# Patient Record
Sex: Male | Born: 1990 | Race: Black or African American | Hispanic: No | Marital: Single | Smoking: Never smoker
Health system: Southern US, Community
[De-identification: ages and names within clinical notes are randomized; demographics above are authoritative.]

## PROBLEM LIST (undated history)

## (undated) DIAGNOSIS — R0789 Other chest pain: Secondary | ICD-10-CM

## (undated) DIAGNOSIS — M25519 Pain in unspecified shoulder: Secondary | ICD-10-CM

## (undated) DIAGNOSIS — G43909 Migraine, unspecified, not intractable, without status migrainosus: Secondary | ICD-10-CM

## (undated) HISTORY — DX: Migraine, unspecified, not intractable, without status migrainosus: G43.909

---

## 2011-05-05 ENCOUNTER — Other Ambulatory Visit: Payer: Self-pay

## 2011-05-05 ENCOUNTER — Emergency Department (HOSPITAL_COMMUNITY): Payer: Self-pay

## 2011-05-05 ENCOUNTER — Emergency Department (HOSPITAL_COMMUNITY)
Admission: EM | Admit: 2011-05-05 | Discharge: 2011-05-06 | Disposition: A | Discharge status: Home / Self Care | Payer: Self-pay | Attending: Emergency Medicine | Admitting: Emergency Medicine

## 2011-05-05 DIAGNOSIS — R079 Chest pain, unspecified: Secondary | ICD-10-CM | POA: Insufficient documentation

## 2011-05-05 DIAGNOSIS — R0789 Other chest pain: Secondary | ICD-10-CM

## 2011-05-05 LAB — DIFFERENTIAL
Basophils Absolute: 0.1 10*3/uL (ref 0.0–0.1)
Lymphocytes Relative: 29 % (ref 12–46)
Lymphs Abs: 2.7 10*3/uL (ref 0.7–4.0)
Monocytes Absolute: 0.9 10*3/uL (ref 0.1–1.0)
Neutro Abs: 5.4 10*3/uL (ref 1.7–7.7)

## 2011-05-05 LAB — CBC
HCT: 45.1 % (ref 39.0–52.0)
Hemoglobin: 15.7 g/dL (ref 13.0–17.0)
RBC: 5.33 MIL/uL (ref 4.22–5.81)
RDW: 12.2 % (ref 11.5–15.5)
WBC: 9.1 10*3/uL (ref 4.0–10.5)

## 2011-05-05 NOTE — ED Notes (Signed)
Pt given warm blankets.

## 2011-05-05 NOTE — ED Notes (Signed)
Pt reprots cp off/on x 2 years

## 2011-05-05 NOTE — ED Provider Notes (Addendum)
History    Scribed for Edward Bonier, MD, the patient was seen in room APA07/APA07. This chart was scribed by Katha Cabal. This patient's care was started at  21:58.     CSN: [id number] Arrival date & time: 05/05/2011  9:54 PM  Chief Complaint  Patient presents with  . Chest Pain    x 2 years off/on    HPI  (Consider location/radiation/quality/duration/timing/severity/associated sxs/prior treatment)  HPI Edward Montgomery is a 20 y.o. male who presents to the Emergency Department complaining of intermittent sharp left chest pain with SOB, light headedness   Pain lasts for several hours.  Nothing makes pain worse.  Pain began about two years ago but has gotten over the last couple of months.  Denies palpitations, cough, diaphoresis, and nausea.    Denies any health problems and smoking.   Patient states that he often lifts heavy boxes at work.   No PCP   PAST MEDICAL HISTORY:  History reviewed. No pertinent past medical history.  PAST SURGICAL HISTORY:  History reviewed. No pertinent past surgical history.  FAMILY HISTORY:  No family history on file.   SOCIAL HISTORY: History   Social History  . Marital Status: Single    Spouse Name: N/A    Number of Children: N/A  . Years of Education: N/A   Social History Main Topics  . Smoking status: Never Smoker   . Smokeless tobacco: None  . Alcohol Use: Yes     opcc  . Drug Use: No  . Sexually Active: No   Other Topics Concern  . None   Social History Narrative  . None     Review of Systems  Review of Systems 10 Systems reviewed and are negative for acute change except as noted in the HPI.  Allergies  Review of patient's allergies indicates not on file.  Home Medications  No current outpatient prescriptions on file.  Physical Exam    BP 129/81  Pulse 96  Temp(Src) 98.9 F (37.2 C) (Oral)  Resp 16  Ht 5\' 6"  (1.676 m)  Wt 220 lb (99.791 kg)  BMI 35.51 kg/m2  SpO2 98%  Physical Exam  Constitutional:  He is oriented to person, place, and time. He appears well-developed and well-nourished.  HENT:  Head: Normocephalic and atraumatic.  Eyes: EOM are normal.  Neck: Neck supple.  Cardiovascular: Normal rate, regular rhythm and normal heart sounds.  Exam reveals no gallop and no friction rub.   No murmur heard. Pulmonary/Chest: Effort normal and breath sounds normal. No respiratory distress. He has no wheezes. He has no rales. He exhibits no tenderness.  Abdominal: Soft. There is no tenderness.  Musculoskeletal: Normal range of motion. He exhibits no edema.  Neurological: He is alert and oriented to person, place, and time.  Skin: Skin is warm and dry. No rash noted.  Psychiatric: He has a normal mood and affect. His behavior is normal.    ED Course  Procedures (including critical care time)  OTHER DATA REVIEWED: Nursing notes, vital signs, and past medical records reviewed.   DIAGNOSTIC STUDIES: Oxygen Saturation is 98% on room air, normal by my interpretation.     Date: 05/05/2011  Rate: 84  Rhythm: normal sinus rhythm  QRS Axis: normal  Intervals: normal  ST/T Wave abnormalities: normal  Conduction Disutrbances:none  Narrative Interpretation:   Old EKG Reviewed: none available Interpreted by EDMD.      LABS / RADIOLOGY:  Results for orders placed during the hospital encounter of 05/05/11  CBC      Component Value Range   WBC 9.1  4.0 - 10.5 (K/uL)   RBC 5.33  4.22 - 5.81 (MIL/uL)   Hemoglobin 15.7  13.0 - 17.0 (g/dL)   HCT 13.0  86.5 - 78.4 (%)   MCV 84.6  78.0 - 100.0 (fL)   MCH 29.5  26.0 - 34.0 (pg)   MCHC 34.8  30.0 - 36.0 (g/dL)   RDW 69.6  29.5 - 28.4 (%)   Platelets 385  150 - 400 (K/uL)  DIFFERENTIAL      Component Value Range   Neutrophils Relative 59  43 - 77 (%)   Neutro Abs 5.4  1.7 - 7.7 (K/uL)   Lymphocytes Relative 29  12 - 46 (%)   Lymphs Abs 2.7  0.7 - 4.0 (K/uL)   Monocytes Relative 9  3 - 12 (%)   Monocytes Absolute 0.9  0.1 - 1.0 (K/uL)    Eosinophils Relative 2  0 - 5 (%)   Eosinophils Absolute 0.1  0.0 - 0.7 (K/uL)   Basophils Relative 1  0 - 1 (%)   Basophils Absolute 0.1  0.0 - 0.1 (K/uL)  BASIC METABOLIC PANEL      Component Value Range   Sodium 138  135 - 145 (mEq/L)   Potassium 3.7  3.5 - 5.1 (mEq/L)   Chloride 102  96 - 112 (mEq/L)   CO2 26  19 - 32 (mEq/L)   Glucose, Bld 106 (*) 70 - 99 (mg/dL)   BUN 15  6 - 23 (mg/dL)   Creatinine, Ser 1.32  0.50 - 1.35 (mg/dL)   Calcium 44.0  8.4 - 10.5 (mg/dL)   GFR calc non Af Amer >60  >60 (mL/min)   GFR calc Af Amer >60  >60 (mL/min)  TROPONIN I      Component Value Range   Troponin I <0.30  <0.30 (ng/mL)     No results found.    ED COURSE / COORDINATION OF CARE:  Orders Placed This Encounter  Procedures  . DG Chest 2 View  . CBC  . Differential  . Basic metabolic panel  . Troponin I  . ED EKG    MDM: Musculoskeletal chest pain, pericarditis, acute coronary syndrome, myocardial infarction, pneumonia are all considered in the patient's differential diagnosis. Musculoskeletal chest pain appears to be the most likely etiology of the patient's pain as the pain is atypical, in that it is paroxysmal, and sharp, intermittent, not brought on by exertion. The patient has not had any cough or fever to suggest a pneumonia. The the intermittent nature of the patient's symptoms do not fit with pericarditis. Hence musculoskeletal chest pain is thought to be most likely. Laboratory studies show no abnormalities and no sign of damage to the heart. The patient's electrocardiogram is normal. He is at this time awaiting chest x-ray to evaluate for any other intrathoracic abnormality causes pain however anticipate this is likely going to be negative and the patient should be able to be discharged home. The patient has been signed out to Dr. Sunnie Nielsen to followup the chest x-ray prior to discharging the patient home.  IMPRESSION: Diagnoses that have been ruled out:  Diagnoses  that are still under consideration:  Final diagnoses:     DISCHARGE MEDICATIONS: New Prescriptions   No medications on file     I personally performed the services described in this documentation, which was scribed in my presence. The recorded information has been reviewed and considered.\  Edward Bonier, MD 05/06/11 0017  CXR reviewed and results shared with PT. Discharged home in stable condition.   Sunnie Nielsen, MD 05/06/11 [phone]

## 2011-05-06 LAB — BASIC METABOLIC PANEL
CO2: 26 mEq/L (ref 19–32)
Chloride: 102 mEq/L (ref 96–112)
Creatinine, Ser: 1.16 mg/dL (ref 0.50–1.35)

## 2011-05-06 LAB — TROPONIN I: Troponin I: <0.3 ng/mL (ref <0.30)

## 2011-05-06 MED ORDER — IBUPROFEN 800 MG PO TABS: 800.0000 mg | ORAL_TABLET | Freq: Three times a day (TID) | ORAL | Status: AC | PRN

## 2011-09-17 ENCOUNTER — Encounter: Payer: Self-pay | Admitting: Family Medicine

## 2011-09-17 ENCOUNTER — Ambulatory Visit (INDEPENDENT_AMBULATORY_CARE_PROVIDER_SITE_OTHER): Payer: BC Managed Care – PPO | Admitting: Family Medicine

## 2011-09-17 VITALS — BP 110/82 | HR 84 | Resp 16 | Ht 66.0 in | Wt 208.8 lb

## 2011-09-17 DIAGNOSIS — G43909 Migraine, unspecified, not intractable, without status migrainosus: Secondary | ICD-10-CM | POA: Insufficient documentation

## 2011-09-17 DIAGNOSIS — R0789 Other chest pain: Secondary | ICD-10-CM

## 2011-09-17 DIAGNOSIS — Z13220 Encounter for screening for lipoid disorders: Secondary | ICD-10-CM

## 2011-09-17 DIAGNOSIS — E669 Obesity, unspecified: Secondary | ICD-10-CM

## 2011-09-17 NOTE — Assessment & Plan Note (Signed)
Discussed need to incorporate exercise into daily routine

## 2011-09-17 NOTE — Assessment & Plan Note (Signed)
Migraines currently are rare, ibuprofen as needed. If he does began to have migraines would give trial of Imitrex

## 2011-09-17 NOTE — Progress Notes (Signed)
  Subjective:    Patient ID: Edward Montgomery, male    DOB: 1991/02/08, 21 y.o.   MRN: [id number]  HPI Patient here to establish care. No previous PCP.  Chest pain- patient has history of atypical chest pain for approximately 2-3 years. He was seen in the emergency room in September 2012 for chest pain. He tends to get sharp chest pain on both sides of the chest when he is lifting boxes at work. He does however have chest pain at home when he is at rest. He denies any shortness of breath, nausea, vomiting, diaphoresis. He denies heartburn. Occasionally he has chest pain when he is taking a deep breath. EKG was within normal limits. Chest x-ray was normal. BMET and CBC were normal. He has a family history of myocardial infarction in his father at age 74. He tends to have improvement in his chest pain when he is not lifting or after he goes home from his long shifts. He does take ibuprofen occasionally which helps.  Migraine- has occasional headaches, no previous treatment, they do not occur that often  He does not exercise  Plans to get degree for early childhood development and business Non smoker Works in [employer] in [place]  He plans to schedule dental and eye visit  Review of Systems   GEN- denies fatigue, fever, weight loss,weakness, recent illness HEENT- denies eye drainage, change in vision, nasal discharge, CVS- +chest pain, palpitations RESP- denies SOB, cough, wheeze ABD- denies N/V, change in stools, abd pain GU- denies dysuria, hematuria, dribbling, incontinence MSK- denies joint pain, muscle aches, injury Neuro- + headache, dizziness, syncope, seizure activity      Objective:   Physical Exam GEN- NAD, alert and oriented x3, overweight  HEENT- PERRL, EOMI, non injected sclera, pink conjunctiva, MMM, oropharynx clear, wax obscuring right TM, left TM canal clear Neck- Supple,  CVS- RRR, no murmur RESP-CTAB ABD- ,NABS, soft, NT ND EXT- No edema Pulses- Radial, DP- 2+  EKG-  NSR, Sept 2012 reviewed CXR- negative      Assessment & Plan:

## 2011-09-17 NOTE — Assessment & Plan Note (Signed)
He has has recurrent chest pain for years without any complications. Work-up negative. I think this more MSK pain, no GI symptoms, not anxious appearing.  Will obtain FLP with family history and pts weight Continue NSAIDS I discussed cardiology with pt, but he is low risk for any cardiac etiology- deferred at this time

## 2011-09-17 NOTE — Patient Instructions (Signed)
It was nice to meet you! I think your chest pain is more musculoskeletal pain- use ibuprofen as needed Get your cholesterol drawn- do not eat after midnight We will call with lab results You should have a yearly physical  I recommend 30 minutes of exercise 5 times a week to maintain a healthy weight  Follow-up as needed

## 2011-09-18 LAB — LIPID PANEL
Cholesterol: 159 mg/dL (ref 0–200)
HDL: 43 mg/dL (ref >39)
Total CHOL/HDL Ratio: 3.7 Ratio

## 2011-09-20 NOTE — Progress Notes (Signed)
Pt aware.

## 2012-08-13 ENCOUNTER — Encounter (HOSPITAL_COMMUNITY): Payer: Self-pay

## 2012-08-13 ENCOUNTER — Emergency Department (HOSPITAL_COMMUNITY)
Admission: EM | Admit: 2012-08-13 | Discharge: 2012-08-13 | Disposition: A | Discharge status: Home / Self Care | Payer: Self-pay | Attending: Emergency Medicine | Admitting: Emergency Medicine

## 2012-08-13 ENCOUNTER — Emergency Department (HOSPITAL_COMMUNITY): Payer: Self-pay

## 2012-08-13 DIAGNOSIS — R079 Chest pain, unspecified: Secondary | ICD-10-CM | POA: Insufficient documentation

## 2012-08-13 DIAGNOSIS — B349 Viral infection, unspecified: Secondary | ICD-10-CM

## 2012-08-13 DIAGNOSIS — Z8679 Personal history of other diseases of the circulatory system: Secondary | ICD-10-CM | POA: Insufficient documentation

## 2012-08-13 DIAGNOSIS — R109 Unspecified abdominal pain: Secondary | ICD-10-CM | POA: Insufficient documentation

## 2012-08-13 DIAGNOSIS — R059 Cough, unspecified: Secondary | ICD-10-CM | POA: Insufficient documentation

## 2012-08-13 DIAGNOSIS — B9789 Other viral agents as the cause of diseases classified elsewhere: Secondary | ICD-10-CM | POA: Insufficient documentation

## 2012-08-13 DIAGNOSIS — IMO0001 Reserved for inherently not codable concepts without codable children: Secondary | ICD-10-CM | POA: Insufficient documentation

## 2012-08-13 DIAGNOSIS — R05 Cough: Secondary | ICD-10-CM | POA: Insufficient documentation

## 2012-08-13 DIAGNOSIS — R42 Dizziness and giddiness: Secondary | ICD-10-CM | POA: Insufficient documentation

## 2012-08-13 DIAGNOSIS — R509 Fever, unspecified: Secondary | ICD-10-CM | POA: Insufficient documentation

## 2012-08-13 MED ORDER — ACETAMINOPHEN 325 MG PO TABS
650.0000 mg | ORAL_TABLET | Freq: Once | ORAL | Status: AC
  Administered 2012-08-13: 650 mg via ORAL
  Filled 2012-08-13: qty 2

## 2012-08-13 MED ORDER — OSELTAMIVIR PHOSPHATE 75 MG PO CAPS
75.0000 mg | ORAL_CAPSULE | Freq: Once | ORAL | Status: AC
  Administered 2012-08-13: 75 mg via ORAL
  Filled 2012-08-13: qty 1

## 2012-08-13 MED ORDER — IBUPROFEN 800 MG PO TABS: 800.0000 mg | ORAL_TABLET | Freq: Three times a day (TID) | ORAL | Status: DC

## 2012-08-13 MED ORDER — OSELTAMIVIR PHOSPHATE 75 MG PO CAPS: 75.0000 mg | ORAL_CAPSULE | Freq: Two times a day (BID) | ORAL | Status: DC

## 2012-08-13 MED ORDER — IBUPROFEN 800 MG PO TABS
800.0000 mg | ORAL_TABLET | Freq: Once | ORAL | Status: AC
  Administered 2012-08-13: 800 mg via ORAL
  Filled 2012-08-13: qty 1

## 2012-08-13 NOTE — ED Provider Notes (Signed)
History     CSN: [id number]  Arrival date & time 08/13/12  0246   First MD Initiated Contact with Patient 08/13/12 0407      Chief Complaint  Patient presents with  . Headache  . Chest Pain  . Abdominal Pain    (Consider location/radiation/quality/duration/timing/severity/associated sxs/prior treatment) HPI Hx per PT. Fever, chills, cough and body aches started yesterday, no known sick contacts, symptoms MOD in severity, no rash, no N/V, no syncope, no SOB, no hemoptysis, no neck pain and no h/o same, did not a get a flu shot this season Past Medical History  Diagnosis Date  . Migraines     History reviewed. No pertinent past surgical history.  Family History  Problem Relation Age of Onset  . Cancer Mother     breast   . Heart disease Father     History  Substance Use Topics  . Smoking status: Never Smoker   . Smokeless tobacco: Not on file  . Alcohol Use: No      Review of Systems  Constitutional: Positive for fever and chills.  HENT: Negative for neck pain, neck stiffness and voice change.   Eyes: Negative for pain.  Respiratory: Positive for cough. Negative for shortness of breath.   Cardiovascular: Negative for chest pain.  Gastrointestinal: Negative for vomiting and abdominal pain.  Genitourinary: Negative for dysuria.  Musculoskeletal: Negative for back pain.  Skin: Negative for rash.  Neurological: Negative for headaches.  All other systems reviewed and are negative.    Allergies  Review of patient's allergies indicates no known allergies.  Home Medications   Current Outpatient Rx  Name  Route  Sig  Dispense  Refill  . IBUPROFEN 800 MG PO TABS   Oral   Take 800 mg by mouth every 8 (eight) hours as needed.           BP 121/56  Pulse 118  Temp 100.1 F (37.8 C) (Oral)  Resp 18  SpO2 98%  Physical Exam  Constitutional: He is oriented to person, place, and time. He appears well-developed and well-nourished.  HENT:  Head:  Normocephalic and atraumatic.  Mouth/Throat: Oropharynx is clear and moist. No oropharyngeal exudate.  Eyes: Conjunctivae normal and EOM are normal. Pupils are equal, round, and reactive to light. No scleral icterus.  Neck: Normal range of motion. Neck supple.  Cardiovascular: Normal rate, regular rhythm and intact distal pulses.   Pulmonary/Chest: Effort normal and breath sounds normal. No respiratory distress. He has no wheezes. He has no rales. He exhibits no tenderness.  Abdominal: Soft. Bowel sounds are normal. He exhibits no distension. There is no tenderness.  Musculoskeletal: Normal range of motion. He exhibits no edema.  Lymphadenopathy:    He has no cervical adenopathy.  Neurological: He is alert and oriented to person, place, and time.  Skin: Skin is warm and dry.    ED Course  Procedures (including critical care time)  Labs Reviewed - No data to display Dg Chest 2 View  08/13/2012  *RADIOLOGY REPORT*  Clinical Data: Chest and epigastric abdominal pain.  CHEST - 2 VIEW  Comparison: Chest radiograph performed 05/06/2011  Findings: The lungs are well-aerated.  Mild left basilar density is thought to reflect the overlying nipple shadow.  Mild vascular congestion is seen.  There is no evidence of focal opacification, pleural effusion or pneumothorax.  The heart is normal in size; the mediastinal contour is within normal limits.  No acute osseous abnormalities are seen.  IMPRESSION: Mild vascular  congestion seen; lungs remain grossly clear.   Original Report Authenticated By: Tonia Ghent, M.D.     Motrin and tylenol and tamiflu provided.   MDM  Flulike symptoms and otherwise healthy adult male. Chest x-ray reviewed as above. Tamiflu provided. Motrin and Tylenol for fever and body aches. Vital signs and nursing notes reviewed and considered. Work note provided. Flu precautions verbalized as understood.        Sunnie Nielsen, MD 08/13/12 [phone]

## 2012-08-13 NOTE — ED Notes (Signed)
Patient c/o headache, chest pain and abd pain x 1 day. Denies fevers, nausea or vomiting. Endorses chills and lightheadness.

## 2012-09-07 ENCOUNTER — Encounter (HOSPITAL_COMMUNITY): Payer: Self-pay | Admitting: *Deleted

## 2012-09-07 ENCOUNTER — Emergency Department (HOSPITAL_COMMUNITY): Payer: Self-pay

## 2012-09-07 ENCOUNTER — Emergency Department (HOSPITAL_COMMUNITY)
Admission: EM | Admit: 2012-09-07 | Discharge: 2012-09-07 | Disposition: A | Discharge status: Home / Self Care | Payer: Self-pay | Attending: Emergency Medicine | Admitting: Emergency Medicine

## 2012-09-07 DIAGNOSIS — R0789 Other chest pain: Secondary | ICD-10-CM | POA: Insufficient documentation

## 2012-09-07 DIAGNOSIS — Z8679 Personal history of other diseases of the circulatory system: Secondary | ICD-10-CM | POA: Insufficient documentation

## 2012-09-07 LAB — CBC WITH DIFFERENTIAL/PLATELET
Basophils Absolute: 0 10*3/uL (ref 0.0–0.1)
Basophils Relative: 0 % (ref 0–1)
Eosinophils Absolute: 0.2 10*3/uL (ref 0.0–0.7)
Eosinophils Relative: 2 % (ref 0–5)
HCT: 43.9 % (ref 39.0–52.0)
MCHC: 34.2 g/dL (ref 30.0–36.0)
MCV: 85.7 fL (ref 78.0–100.0)
Monocytes Absolute: 0.7 10*3/uL (ref 0.1–1.0)
RDW: 12.3 % (ref 11.5–15.5)

## 2012-09-07 LAB — BASIC METABOLIC PANEL
Calcium: 9.5 mg/dL (ref 8.4–10.5)
Creatinine, Ser: 1.16 mg/dL (ref 0.50–1.35)
GFR calc Af Amer: >90 mL/min (ref >90)

## 2012-09-07 LAB — TROPONIN I: Troponin I: <0.3 ng/mL (ref <0.30)

## 2012-09-07 MED ORDER — IBUPROFEN 800 MG PO TABS: 800.0000 mg | ORAL_TABLET | Freq: Three times a day (TID) | ORAL | Status: DC

## 2012-09-07 NOTE — ED Notes (Signed)
Pt gone to X-Ray 

## 2012-09-07 NOTE — ED Notes (Signed)
Intermittent left sided cp x 4 days.  Denies sob/n/v/dizziness.  States pain comes and goes and reports heavy lifting while working.  States job sent him home from work x 4 days ago and told him not to come back without a doctor's note.

## 2012-09-07 NOTE — ED Provider Notes (Addendum)
History  This chart was scribed for Edward Jakes, MD by Erskine Emery, ED Scribe. This patient was seen in room APA02/APA02 and the patient's care was started at 16:43.   CSN: [id number]  Arrival date & time 09/07/12  1636   First MD Initiated Contact with Patient 09/07/12 1643      Chief Complaint  Patient presents with  . Chest Pain    (Consider location/radiation/quality/duration/timing/severity/associated sxs/prior treatment) The history is provided by the patient. No language interpreter was used.  Edward Montgomery is a 22 y.o. male who presents to the Emergency Department complaining of intermittent left-sided chest pain for the past 4.5 days in episodes of about 15-20 minutes, sometimes lasting an hour. Pt reports the pain sometimes radiates to the mid chest or the right side of the chest but never radiates to his arms. He denies any associated SOB, nausea, emesis, dizziness, headaches, cough, congestion, rash, back pain, neck pain, appetite change, sore throat, visual changes, abdominal pain, dysuria, body aches, or h/o bleeding easily. He claims nothing relieves the pain and nothing aggravates it, including exertion or lifting heavy objects. Pt has a h/o similar symptoms but he has never been evaluated for it. Pt was here for the flu at the beginning of the month.  Dr. Jeanice Lim is the pt's PCP.  Past Medical History  Diagnosis Date  . Migraines     History reviewed. No pertinent past surgical history.  Family History  Problem Relation Age of Onset  . Cancer Mother     breast   . Heart disease Father     History  Substance Use Topics  . Smoking status: Never Smoker   . Smokeless tobacco: Not on file  . Alcohol Use: No      Review of Systems  Constitutional: Negative for fever, chills and appetite change.  HENT: Negative for congestion, sore throat, rhinorrhea and neck pain.   Eyes: Negative for visual disturbance.  Respiratory: Negative for cough and shortness  of breath.   Cardiovascular: Positive for chest pain.  Gastrointestinal: Negative for nausea, vomiting and abdominal pain.  Genitourinary: Negative for dysuria.  Musculoskeletal: Negative for back pain.  Skin: Negative for rash.  Neurological: Negative for dizziness and headaches.  Hematological: Does not bruise/bleed easily.  Psychiatric/Behavioral: Negative for sleep disturbance.  All other systems reviewed and are negative.    Allergies  Review of patient's allergies indicates no known allergies.  Home Medications   Current Outpatient Rx  Name  Route  Sig  Dispense  Refill  . IBUPROFEN 800 MG PO TABS   Oral   Take 400-800 mg by mouth 3 (three) times daily.         . IBUPROFEN 800 MG PO TABS   Oral   Take 1 tablet (800 mg total) by mouth 3 (three) times daily.   21 tablet   0     Triage Vitals: BP 133/73  Pulse 110  Temp 98.4 F (36.9 C) (Oral)  Resp 20  Ht 5\' 6"  (1.676 m)  Wt 230 lb (104.327 kg)  BMI 37.12 kg/m2  SpO2 99%  Physical Exam  Nursing note and vitals reviewed. Constitutional: He is oriented to person, place, and time. He appears well-developed and well-nourished. No distress.  HENT:  Head: Normocephalic and atraumatic.  Mouth/Throat: Oropharynx is clear and moist.  Eyes: EOM are normal. Pupils are equal, round, and reactive to light.  Neck: Neck supple. No tracheal deviation present.  Cardiovascular: Regular rhythm.  Tachycardia present.  Mild tachycardia  Pulmonary/Chest: Effort normal. No respiratory distress.  Abdominal: Soft. Bowel sounds are normal. He exhibits no distension. There is no tenderness.  Musculoskeletal: Normal range of motion. He exhibits no edema.  Lymphadenopathy:    He has no cervical adenopathy.  Neurological: He is alert and oriented to person, place, and time. No cranial nerve deficit. Coordination normal.  Skin: Skin is warm and dry.  Psychiatric: He has a normal mood and affect.    ED Course  Procedures  (including critical care time) DIAGNOSTIC STUDIES: Oxygen Saturation is 99% on room air, normal by my interpretation.    COORDINATION OF CARE: 17:00--I evaluated the patient and we discussed a treatment plan including chest x-ray, EKG, cardiac marker, and blood work to which the pt agreed.   Labs Reviewed  BASIC METABOLIC PANEL - Abnormal; Notable for the following:    GFR calc non Af Amer 87 (*)     All other components within normal limits  D-DIMER, QUANTITATIVE  CBC WITH DIFFERENTIAL  TROPONIN I   Dg Chest 2 View  09/07/2012  *RADIOLOGY REPORT*  Clinical Data: 22 year old male with chest pain.  CHEST - 2 VIEW  Comparison: 08/13/2012 chest radiograph  Findings: The cardiomediastinal silhouette is unremarkable. The lungs are clear. There is no evidence of focal airspace disease, pulmonary edema, suspicious pulmonary nodule/mass, pleural effusion, or pneumothorax. No acute bony abnormalities are identified.  IMPRESSION: No evidence of active cardiopulmonary disease.   Original Report Authenticated By: Edward Montgomery, M.D.    Results for orders placed during the hospital encounter of 09/07/12  D-DIMER, QUANTITATIVE      Component Value Range   D-Dimer, Quant <0.27  0.00 - 0.48 ug/mL-FEU  CBC WITH DIFFERENTIAL      Component Value Range   WBC 9.3  4.0 - 10.5 K/uL   RBC 5.12  4.22 - 5.81 MIL/uL   Hemoglobin 15.0  13.0 - 17.0 g/dL   HCT 91.4  78.2 - 95.6 %   MCV 85.7  78.0 - 100.0 fL   MCH 29.3  26.0 - 34.0 pg   MCHC 34.2  30.0 - 36.0 g/dL   RDW 21.3  08.6 - 57.8 %   Platelets 385  150 - 400 K/uL   Neutrophils Relative 68  43 - 77 %   Neutro Abs 6.3  1.7 - 7.7 K/uL   Lymphocytes Relative 23  12 - 46 %   Lymphs Abs 2.2  0.7 - 4.0 K/uL   Monocytes Relative 7  3 - 12 %   Monocytes Absolute 0.7  0.1 - 1.0 K/uL   Eosinophils Relative 2  0 - 5 %   Eosinophils Absolute 0.2  0.0 - 0.7 K/uL   Basophils Relative 0  0 - 1 %   Basophils Absolute 0.0  0.0 - 0.1 K/uL  BASIC METABOLIC PANEL       Component Value Range   Sodium 138  135 - 145 mEq/L   Potassium 3.7  3.5 - 5.1 mEq/L   Chloride 102  96 - 112 mEq/L   CO2 28  19 - 32 mEq/L   Glucose, Bld 93  70 - 99 mg/dL   BUN 14  6 - 23 mg/dL   Creatinine, Ser 4.69  0.50 - 1.35 mg/dL   Calcium 9.5  8.4 - 62.9 mg/dL   GFR calc non Af Amer 87 (*) >90 mL/min   GFR calc Af Amer >90  >90 mL/min  TROPONIN I  Component Value Range   Troponin I <0.30  <0.30 ng/mL    Date: 09/07/2012  Rate: 96  Rhythm: normal sinus rhythm  QRS Axis: normal  Intervals: normal  ST/T Wave abnormalities: normal  Conduction Disutrbances:none  Narrative Interpretation:   Old EKG Reviewed: unchanged    1. Chest pain, atypical       MDM  Patient workup for chest pain without any specific abnormalities no evidence of pulmonary and wasn't based on d-dimer chest x-ray was negative. For pneumonia pneumothorax. No leukocytosis lites are normal troponin was negative no acute cardiac findings on EKG. Most likely nonspecific atypical chest pain can be discharged home and at work.      I personally performed the services described in this documentation, which was scribed in my presence. The recorded information has been reviewed and is accurate.     Edward Jakes, MD 09/07/12 Nida Boatman  Edward Jakes, MD 09/08/12 [phone]

## 2012-10-13 ENCOUNTER — Ambulatory Visit (INDEPENDENT_AMBULATORY_CARE_PROVIDER_SITE_OTHER): Payer: 59 | Admitting: Family Medicine

## 2012-10-13 ENCOUNTER — Encounter: Payer: Self-pay | Admitting: Family Medicine

## 2012-10-13 VITALS — BP 120/88 | HR 100 | Resp 15 | Ht 66.0 in | Wt 207.0 lb

## 2012-10-13 DIAGNOSIS — R0789 Other chest pain: Secondary | ICD-10-CM

## 2012-10-13 DIAGNOSIS — E669 Obesity, unspecified: Secondary | ICD-10-CM

## 2012-10-13 NOTE — Progress Notes (Signed)
  Subjective:    Patient ID: Edward Montgomery, male    DOB: 1991/01/30, 22 y.o.   MRN: [id number]  HPI  Patient presents to followup ER evaluation for chest pain. This is the third time he has been evaluated in the ER for chest pain. His father had a myocardial infarction at age 22 about a year ago. He describes the pain as sharp stabbing pain on the left side of his chest occasionally gets on the right it is nonradiating no association with shortness of breath nausea vomiting. 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 Of note he's been out of work for the past week secondary to recurrent chest pain. He's had a few episodes of chest pain where was a burning sensation but denies heartburn symptoms he denies any cough. He denies feeling anxious or stressed or depressed.  Review of Systems  GEN- denies fatigue, fever, weight loss,weakness, recent illness HEENT- denies eye drainage, change in vision, nasal discharge, CVS- + chest pain, palpitations RESP- denies SOB, cough, wheeze ABD- denies N/V, change in stools, abd pain GU- denies dysuria, hematuria, dribbling, incontinence MSK- denies joint pain, muscle aches, injury Neuro- denies headache, dizziness, syncope, seizure activity      Objective:   Physical Exam GEN- NAD, alert and oriented x3 HEENT- PERRL, EOMI, non injected sclera, pink conjunctiva, MMM, oropharynx clear Neck- Supple, CVS- RRR, no murmur RESP-CTAB ABD-NABS,soft,NT,ND EXT- No edema Pulses- Radial 2+ Psych- normal affect and mood Chest wall- NT       Assessment & Plan:

## 2012-10-13 NOTE — Assessment & Plan Note (Addendum)
Persistent atypical chest pain he can have episodes almost daily in a week. There is some concern with his father who has had a myocardial infarction in his 82s. At this time we'll proceed with cardiology for evaluation. I advised him if he has a burning sensation to use an antacid. I think this will be more for reassurance for him and his family.  He's not giving typical GI symptoms and denies any anxiety or stress or mood disorder. Labs are unremarkable lipid panel had very mild elevation in LDL otherwise normal

## 2012-10-13 NOTE — Patient Instructions (Signed)
Referral to cardiology- [facility] Try TUMS or Mylanta if you get a burning sensation

## 2012-10-13 NOTE — Assessment & Plan Note (Signed)
He has lost some weight recently, continue to encourage exercise

## 2012-10-23 ENCOUNTER — Encounter: Payer: Self-pay | Admitting: Internal Medicine

## 2012-10-23 ENCOUNTER — Ambulatory Visit (INDEPENDENT_AMBULATORY_CARE_PROVIDER_SITE_OTHER): Payer: 59 | Admitting: Internal Medicine

## 2012-10-23 VITALS — BP 120/72 | HR 95 | Ht 66.0 in | Wt 207.5 lb

## 2012-10-23 DIAGNOSIS — R079 Chest pain, unspecified: Secondary | ICD-10-CM | POA: Insufficient documentation

## 2012-10-23 NOTE — Patient Instructions (Addendum)
Your physician recommends that you schedule a follow-up appointment in: PRN 

## 2012-10-23 NOTE — Progress Notes (Signed)
HPI Pains began a few years ago  Sharp pains on and off.  Can just be laying in bed.  Occasionally woke up with.  Usually starts on L and can shoot to mid or R side.  Nothing really affects.  Goes away on own  Occur up to 2x per day.  Or 1 x per week  Random.  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 Father had MI 1/ 2013  He is a smoker  Not sure of other med problems.  Lipids showed LDL 106  HDL 43 No Known Allergies  Current Outpatient Prescriptions  Medication Sig Dispense Refill  . ibuprofen (ADVIL,MOTRIN) 800 MG tablet Take 1 tablet (800 mg total) by mouth 3 (three) times daily.  21 tablet  0   No current facility-administered medications for this visit.    Past Medical History  Diagnosis Date  . Migraines     No past surgical history on file.  Family History  Problem Relation Age of Onset  . Cancer Mother     breast   . Heart disease Father     History   Social History  . Marital Status: Single    Spouse Name: N/A    Number of Children: N/A  . Years of Education: N/A   Occupational History  . Not on file.   Social History Main Topics  . Smoking status: Never Smoker   . Smokeless tobacco: Not on file  . Alcohol Use: No  . Drug Use: No  . Sexually Active: No   Other Topics Concern  . Not on file   Social History Narrative  . No narrative on file    Review of Systems:  All systems reviewed.  They are negative to the above problem except as previously stated.  Vital Signs: BP 120/72  Pulse 95  Ht 5\' 6"  (1.676 m)  Wt 207 lb 8 oz (94.121 kg)  BMI 33.51 kg/m2  Physical Exam Patient is in NAD HEENT:  Normocephalic, atraumatic. EOMI, PERRLA.  Neck: JVP is normal.  No bruits.  Lungs: clear to auscultation. No rales no wheezes.  Heart: Regular rate and rhythm. Normal S1, S2. No S3.   No significant murmurs. PMI not displaced.  Abdomen:  Supple, nontender. Normal bowel  sounds. No masses. No hepatomegaly.  Extremities:   Good distal pulses throughout. No lower extremity edema.  Musculoskeletal :moving all extremities.  Neuro:   alert and oriented x3.  CN II-XII grossly intact.  EKG (09/08/2012)  ST 111 BPM Assessment and Plan:  CP  Atypical for cardiac.  I would not recomm furhter testing  Poss deep musculoskeletal.  COuld try PTwith stretching  HCM LIpids good  I did recomm that he increase his exercise and try to lose some wt  May help symptoms  F/U PRN.

## 2012-12-06 ENCOUNTER — Emergency Department (HOSPITAL_COMMUNITY)
Admission: EM | Admit: 2012-12-06 | Discharge: 2012-12-06 | Disposition: A | Discharge status: Home / Self Care | Payer: 59 | Attending: Emergency Medicine | Admitting: Emergency Medicine

## 2012-12-06 ENCOUNTER — Encounter (HOSPITAL_COMMUNITY): Payer: Self-pay | Admitting: *Deleted

## 2012-12-06 ENCOUNTER — Emergency Department (HOSPITAL_COMMUNITY): Payer: 59

## 2012-12-06 DIAGNOSIS — Y9239 Other specified sports and athletic area as the place of occurrence of the external cause: Secondary | ICD-10-CM | POA: Insufficient documentation

## 2012-12-06 DIAGNOSIS — Z79899 Other long term (current) drug therapy: Secondary | ICD-10-CM | POA: Insufficient documentation

## 2012-12-06 DIAGNOSIS — X500XXA Overexertion from strenuous movement or load, initial encounter: Secondary | ICD-10-CM | POA: Insufficient documentation

## 2012-12-06 DIAGNOSIS — Z8679 Personal history of other diseases of the circulatory system: Secondary | ICD-10-CM | POA: Insufficient documentation

## 2012-12-06 DIAGNOSIS — Y9367 Activity, basketball: Secondary | ICD-10-CM | POA: Insufficient documentation

## 2012-12-06 DIAGNOSIS — S93409A Sprain of unspecified ligament of unspecified ankle, initial encounter: Secondary | ICD-10-CM | POA: Insufficient documentation

## 2012-12-06 MED ORDER — IBUPROFEN 600 MG PO TABS: 600.0000 mg | ORAL_TABLET | Freq: Four times a day (QID) | ORAL | Status: DC | PRN

## 2012-12-06 NOTE — ED Notes (Signed)
nad noted prior to dc. Dc instructions reviewed and explained. 1 script given along with work note

## 2012-12-06 NOTE — ED Notes (Signed)
Pt c/o left ankle and foot pain. Pt was playing basketball yesterday and turned his ankle.

## 2012-12-08 NOTE — ED Provider Notes (Signed)
History     CSN: [id number]  Arrival date & time 12/06/12  2050   First MD Initiated Contact with Patient 12/06/12 2123      Chief Complaint  Patient presents with  . Ankle Pain    (Consider location/radiation/quality/duration/timing/severity/associated sxs/prior treatment) Patient is a 22 y.o. male presenting with ankle pain. The history is provided by the patient.  Ankle Pain Location:  Ankle Time since incident:  1 day Injury: yes   Mechanism of injury comment:  Inversion injury while playing basketball yesterday Ankle location:  L ankle Pain details:    Quality:  Aching and throbbing   Radiates to:  Does not radiate   Severity:  Moderate   Onset quality:  Sudden   Timing:  Constant   Progression:  Unchanged Chronicity:  New Dislocation: no   Relieved by:  Rest and ice Worsened by:  Bearing weight Ineffective treatments:  None tried Associated symptoms: decreased ROM and swelling   Associated symptoms: no back pain, no fever, no neck pain, no numbness and no tingling     Past Medical History  Diagnosis Date  . Migraines     History reviewed. No pertinent past surgical history.  Family History  Problem Relation Age of Onset  . Cancer Mother     breast   . Heart disease Father     History  Substance Use Topics  . Smoking status: Never Smoker   . Smokeless tobacco: Not on file  . Alcohol Use: No      Review of Systems  Constitutional: Negative for fever.  HENT: Negative for neck pain.   Gastrointestinal: Negative for nausea.  Musculoskeletal: Positive for joint swelling and arthralgias. Negative for back pain.  Skin: Negative for wound.  Neurological: Negative for weakness and numbness.    Allergies  Review of patient's allergies indicates no known allergies.  Home Medications   Current Outpatient Rx  Name  Route  Sig  Dispense  Refill  . ibuprofen (ADVIL,MOTRIN) 600 MG tablet   Oral   Take 1 tablet (600 mg total) by mouth every 6 (six)  hours as needed for pain.   20 tablet   0   . ibuprofen (ADVIL,MOTRIN) 800 MG tablet   Oral   Take 1 tablet (800 mg total) by mouth 3 (three) times daily.   21 tablet   0     BP 120/78  Pulse 85  Temp(Src) 97.4 F (36.3 C) (Oral)  Resp 18  SpO2 99%  Physical Exam  Nursing note and vitals reviewed. Constitutional: He appears well-developed and well-nourished.  HENT:  Head: Normocephalic.  Cardiovascular: Normal rate and intact distal pulses.  Exam reveals no decreased pulses.   Pulses:      Dorsalis pedis pulses are 2+ on the right side, and 2+ on the left side.       Posterior tibial pulses are 2+ on the right side, and 2+ on the left side.  Musculoskeletal: He exhibits edema and tenderness.       Left ankle: He exhibits decreased range of motion, swelling and ecchymosis. He exhibits normal pulse. Tenderness. Lateral malleolus tenderness found. No head of 5th metatarsal and no proximal fibula tenderness found. Achilles tendon normal.  Neurological: He is alert. No sensory deficit.  Skin: Skin is warm, dry and intact.    ED Course  Procedures (including critical care time)  Labs Reviewed - No data to display Dg Ankle Complete Left  12/06/2012  *RADIOLOGY REPORT*  Clinical Data: Proximal  foot pain, ankle pain, and swelling after inversion injury during basketball.  LEFT ANKLE COMPLETE - 3+ VIEW  Comparison: None.  Findings: There is an ununited ossicle inferior to the lateral malleolus with suggestion of irregular border.  This may represent avulsion fracture.  No displaced fractures identified.  The ankle mortis and talar dome appear intact.  No focal bone lesion or bone destruction.  IMPRESSION: Ununited ossicle consistent with avulsion fracture of the lateral malleolus.   Original Report Authenticated By: Burman Nieves, M.D.    Dg Foot Complete Left  12/06/2012  *RADIOLOGY REPORT*  Clinical Data: Foot ankle pain and swelling after inversion injury.  LEFT FOOT - COMPLETE  3+ VIEW  Comparison: None.  Findings: Left foot appears intact. No evidence of acute fracture or subluxation.  No focal bone lesions.  Bone matrix and cortex appear intact.  No abnormal radiopaque densities in the soft tissues.  IMPRESSION: No acute bony abnormalities demonstrated in the left foot.   Original Report Authenticated By: Burman Nieves, M.D.      1. Ankle sprain and strain, left, initial encounter       MDM  Patients labs and/or radiological studies were viewed and considered during the medical decision making and disposition process.  ASO and crutches provided.  Cap refill normal after ASO applied.  RICE, referral to pcp for a recheck of injury within the next week.  Discussed pt may require PT for full tx of this injury.  Ibuprofen prescribed.        Burgess Amor, PA-C 12/08/12 1329

## 2012-12-09 NOTE — ED Provider Notes (Signed)
Medical screening examination/treatment/procedure(s) were performed by non-physician practitioner and as supervising physician I was immediately available for consultation/collaboration.    Gilda Crease, MD 12/09/12 [phone]

## 2012-12-10 ENCOUNTER — Encounter: Payer: Self-pay | Admitting: Family Medicine

## 2012-12-10 ENCOUNTER — Ambulatory Visit (INDEPENDENT_AMBULATORY_CARE_PROVIDER_SITE_OTHER): Payer: 59 | Admitting: Family Medicine

## 2012-12-10 VITALS — BP 110/80 | HR 89 | Resp 16 | Wt 208.8 lb

## 2012-12-10 DIAGNOSIS — S93402A Sprain of unspecified ligament of left ankle, initial encounter: Secondary | ICD-10-CM | POA: Insufficient documentation

## 2012-12-10 DIAGNOSIS — S93402D Sprain of unspecified ligament of left ankle, subsequent encounter: Secondary | ICD-10-CM

## 2012-12-10 DIAGNOSIS — Z5189 Encounter for other specified aftercare: Secondary | ICD-10-CM

## 2012-12-10 NOTE — Progress Notes (Signed)
  Subjective:    Patient ID: Edward Montgomery, male    DOB: August 11, 1991, 22 y.o.   MRN: [id number]  HPI Patient here to followup ER visit for left ankle sprain he's been out of work until today. He had an x-ray which was negative he was playing basketball on Saturday and rolled his ankle. He was evaluated in the ER on Sunday. He states pain is much improved and the swelling has gone down he is no longer using crutches and is able to bear  weight without the brace but for short periods of time   Review of Systems - per above  GEN- denies fatigue, fever, weight loss,weakness, recent illness MSK- + joint pain, muscle aches, injury        Objective:   Physical Exam GEN-NAD,alert and oriented x 3 MSK- Bilateral ankle no swelling, Left TTP medial and lateral malleous and 1-2cm anterior to medial malleous. Able to stand on one foot and lift toes. Pain with inversion left foot, good ROM. Bilateral knees, normal inspection ROM. Strength good bilat LE Pulse- DP, PT 2+       Assessment & Plan:

## 2012-12-10 NOTE — Assessment & Plan Note (Signed)
Will allow him to continue a support brace he can return to work as scheduled tomorrow. He can continue anti-inflammatories as needed and icing. Advised him to wear for one week while he is getting back to his regular duties at work then he is to remove the brace and see how he does. He continues to have significant pain we'll refer to orthopedics at that time

## 2012-12-10 NOTE — Patient Instructions (Signed)
Ankle sprain, continue brace with work this next week, then remove  Continue ibuprofen If pain persist after 2 weeks call for referral to ortho

## 2012-12-31 ENCOUNTER — Encounter: Payer: Self-pay | Admitting: Family Medicine

## 2012-12-31 ENCOUNTER — Ambulatory Visit (INDEPENDENT_AMBULATORY_CARE_PROVIDER_SITE_OTHER): Payer: 59 | Admitting: Family Medicine

## 2012-12-31 VITALS — BP 122/78 | HR 99 | Resp 18 | Ht 66.0 in | Wt 206.1 lb

## 2012-12-31 DIAGNOSIS — R0789 Other chest pain: Secondary | ICD-10-CM

## 2012-12-31 MED ORDER — MELOXICAM 7.5 MG PO TABS: 7.5000 mg | ORAL_TABLET | Freq: Every day | ORAL | Status: AC

## 2012-12-31 MED ORDER — RANITIDINE HCL 150 MG PO TABS: 150.0000 mg | ORAL_TABLET | Freq: Every day | ORAL | Status: DC

## 2012-12-31 NOTE — Assessment & Plan Note (Signed)
Atypical chest pain he has been evaluated by cardiology who agrees. The pharmacist is probably musculoskeletal may be deep into the tissue. He does not give any signs of anxiety or depression today. I did discuss with him whether or not he enjoys his job as he is at home for the past 3 days without coming in for evaluation to return to work he states that he has no problems with his job or his home life. He's not exercising on a regular basis this was also discussed. I will start him on meloxicam once a day I will also add Zantac for possible underlying indigestion or reflux that could cause symptoms. He will return in followup in 4 weeks I will consider physical therapy which was mentioned by the cardiologist. He is a non smoker, neg CXR in January

## 2012-12-31 NOTE — Patient Instructions (Signed)
Try the zantac in the evening  Take the meloxicam with food once a day  Released back to work  F/U 4 weeks [employer]

## 2012-12-31 NOTE — Progress Notes (Signed)
  Subjective:    Patient ID: Edward Montgomery, male    DOB: 1991-05-16, 22 y.o.   MRN: [id number]  HPI  Patient had a followup chest pain. He has history of chronic atypical chest pain. Occurs daily. Has been evaluated by cardiology, thought to be MSK. He severe episode Monday, sent home from work, he comes in today to be released back to work. Denies heartburn symptoms or palpitations, denies depression or anxiety. Has occasional SOB with his episodes. CP sharp in nature, center of chest non radiating, last a few minutes   Review of Systems   GEN- denies fatigue, fever, weight loss,weakness, recent illness HEENT- denies eye drainage, change in vision, nasal discharge, CVS- +chest pain, palpitations RESP- denies SOB, cough, wheeze ABD- denies N/V, change in stools, abd pain MSK- denies joint pain, muscle aches, injury Neuro- denies headache, dizziness, syncope, seizure activity      Objective:   Physical Exam  GEN- NAD, alert and oriented x3 HEENT- PERRL, EOMI, non injected sclera, pink conjunctiva, MMM, oropharynx clear CVS- RRR, no murmur Chest Wall- NT RESP-CTAB EXT- No edema Pulses- Radial, DP- 2+        Assessment & Plan:

## 2013-06-17 ENCOUNTER — Other Ambulatory Visit: Payer: Self-pay

## 2014-06-13 IMAGING — CR DG CHEST 2V
2 series · 2 of 2 positions shown · non-contrast
Comparison: Chest radiograph performed 05/06/2011

CLINICAL DATA: Chest and epigastric abdominal pain.

CHEST - 2 VIEW

[w chest pa]
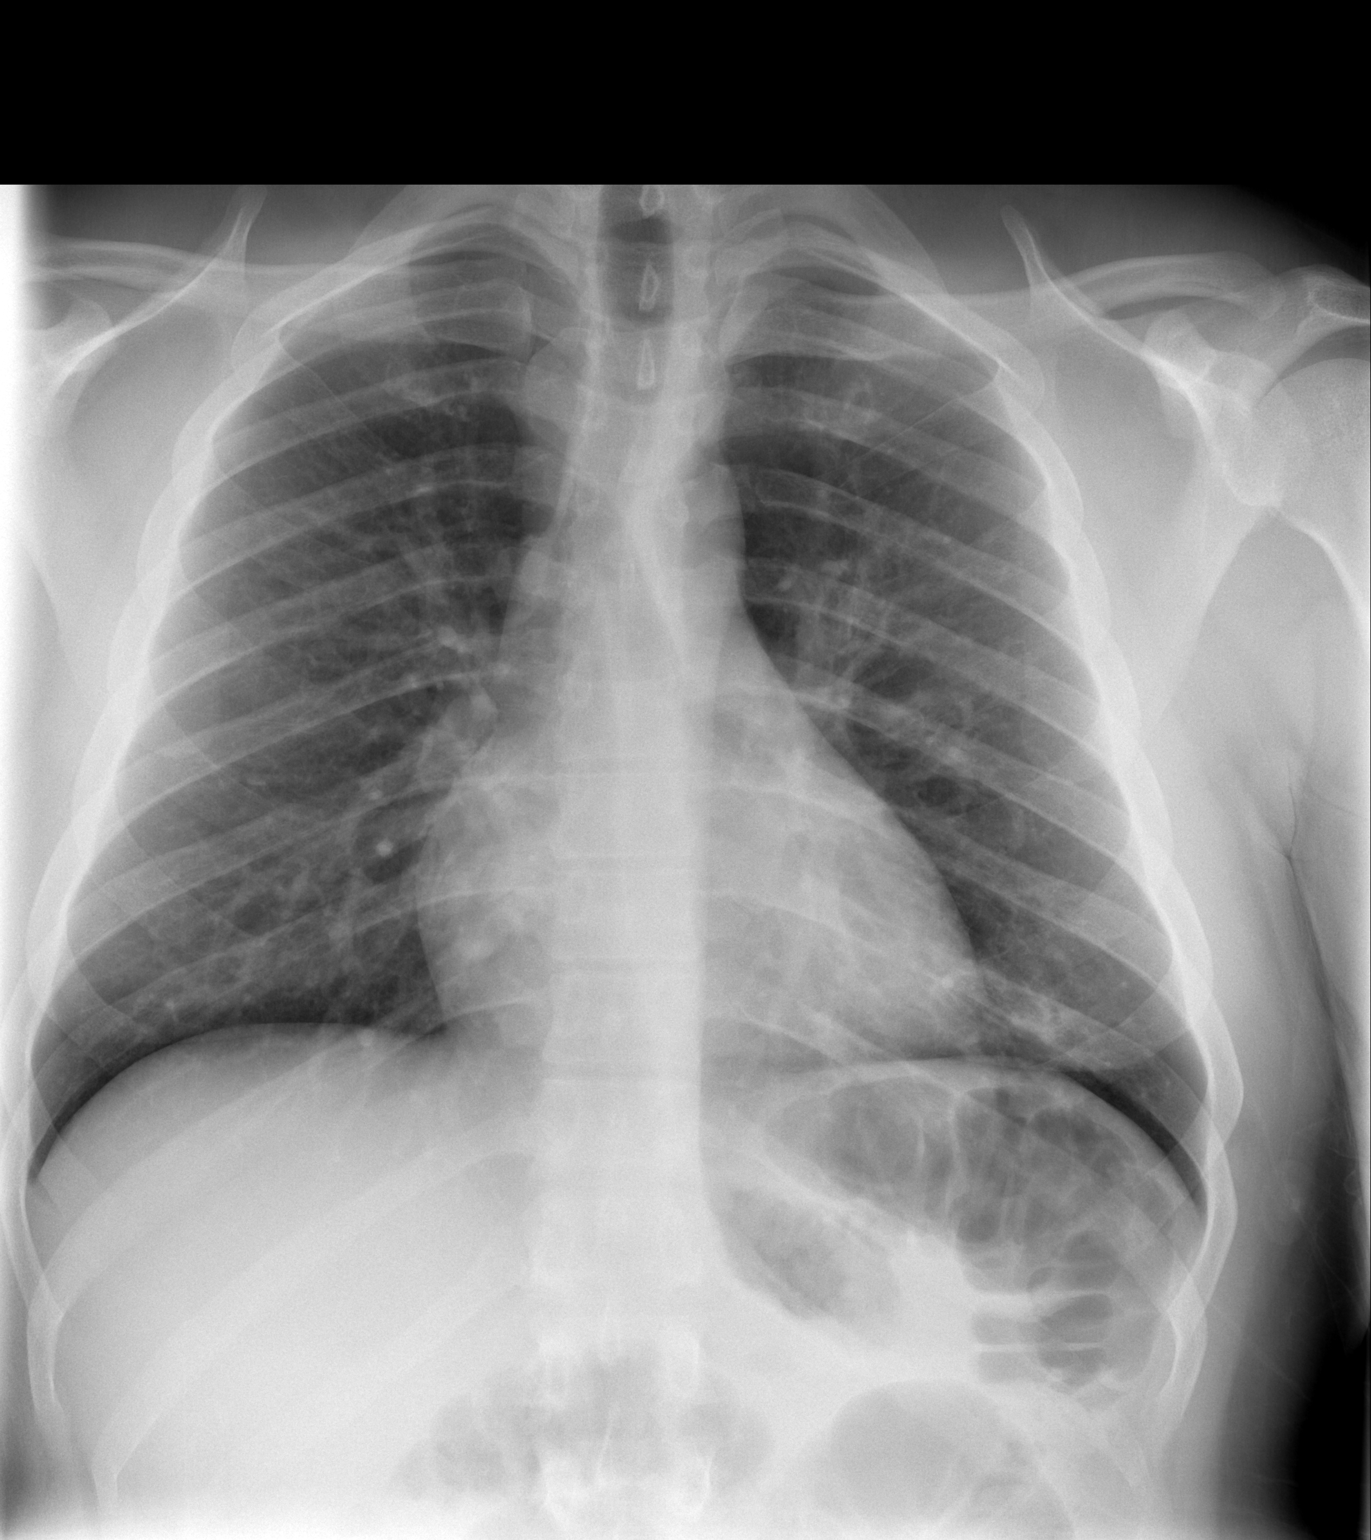

[w chest lat]
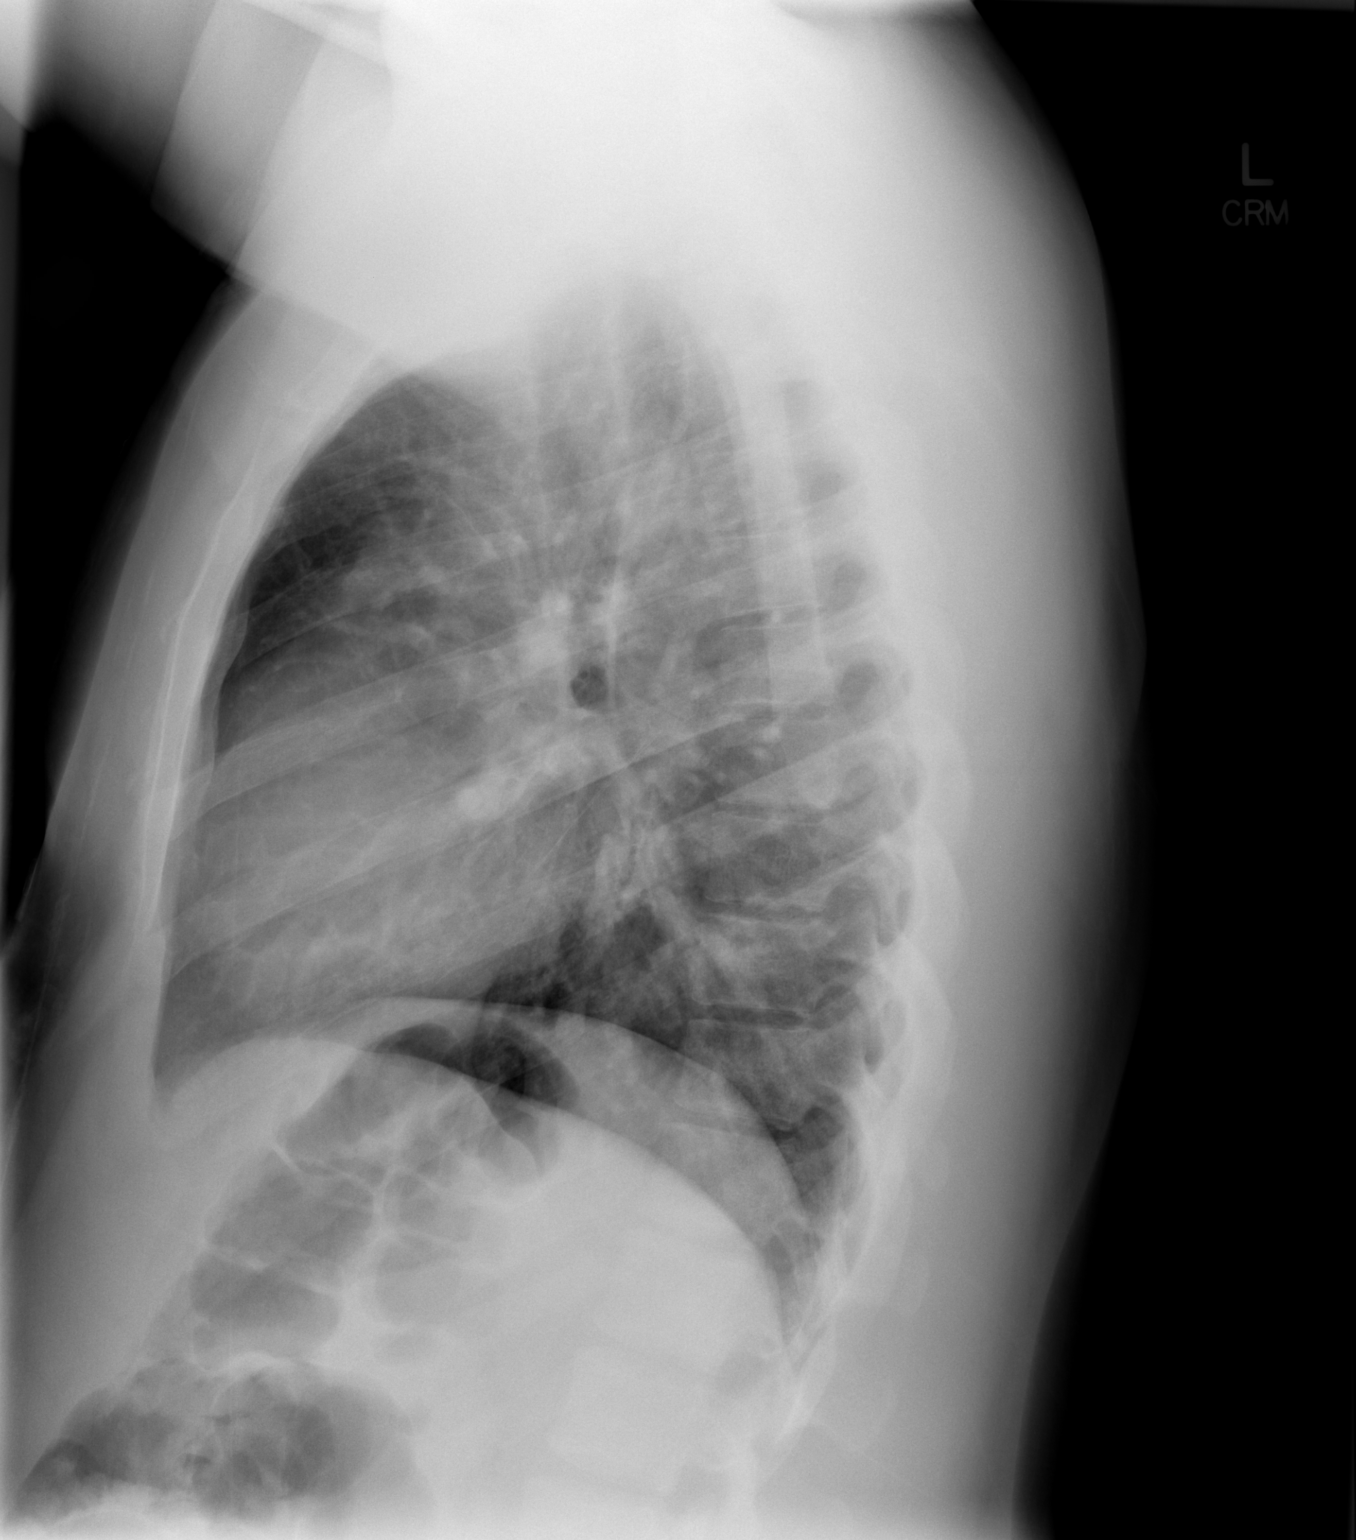

[2 of 2 positions shown; findings below may reference images not displayed]

FINDINGS: The lungs are well-aerated.  Mild left basilar density is
thought to reflect the overlying nipple shadow.  Mild vascular
congestion is seen.  There is no evidence of focal opacification,
pleural effusion or pneumothorax.

The heart is normal in size; the mediastinal contour is within
normal limits.  No acute osseous abnormalities are seen.
IMPRESSION: Mild vascular congestion seen; lungs remain grossly clear.

## 2015-03-11 ENCOUNTER — Emergency Department (HOSPITAL_COMMUNITY)
Admission: EM | Admit: 2015-03-11 | Discharge: 2015-03-11 | Disposition: A | Discharge status: Home / Self Care | Payer: Self-pay | Attending: Emergency Medicine | Admitting: Emergency Medicine

## 2015-03-11 ENCOUNTER — Encounter (HOSPITAL_COMMUNITY): Payer: Self-pay | Admitting: Emergency Medicine

## 2015-03-11 ENCOUNTER — Emergency Department (HOSPITAL_COMMUNITY): Payer: Self-pay

## 2015-03-11 DIAGNOSIS — Z8679 Personal history of other diseases of the circulatory system: Secondary | ICD-10-CM | POA: Insufficient documentation

## 2015-03-11 DIAGNOSIS — R0602 Shortness of breath: Secondary | ICD-10-CM | POA: Insufficient documentation

## 2015-03-11 DIAGNOSIS — F419 Anxiety disorder, unspecified: Secondary | ICD-10-CM | POA: Insufficient documentation

## 2015-03-11 MED ORDER — HYDROXYZINE PAMOATE 25 MG PO CAPS: 25.0000 mg | ORAL_CAPSULE | Freq: Every evening | ORAL | Status: AC | PRN

## 2015-03-11 NOTE — ED Provider Notes (Signed)
CSN: [id number]     Arrival date & time 03/11/15  1317 History   First MD Initiated Contact with Patient 03/11/15 1323     Chief Complaint  Patient presents with  . Shortness of Breath      HPI  Patient presents for evaluation of shortness of breath. States that he was short of breath last night. He admits he did feel somewhat anxious. I relayed down he states he would wake back up and have to sit up. Colicky the laid down something "bad was going to happen". He is not prone anxiety. Denies any chest pain today. However, he states "every now and then" he will get pain in his chest and has for years. Not today. No history of DVT or PE. No family history of heart disease. Is a nonsmoker. No history of pneumothorax. No trauma. No recent illness fevers chills cough.  Past Medical History  Diagnosis Date  . Migraines    History reviewed. No pertinent past surgical history. Family History  Problem Relation Age of Onset  . Cancer Mother     breast   . Heart disease Father    History  Substance Use Topics  . Smoking status: Never Smoker   . Smokeless tobacco: Not on file  . Alcohol Use: No    Review of Systems  Constitutional: Negative for fever, chills, diaphoresis, appetite change and fatigue.  HENT: Negative for mouth sores, sore throat and trouble swallowing.   Eyes: Negative for visual disturbance.  Respiratory: Positive for shortness of breath. Negative for cough, chest tightness and wheezing.   Cardiovascular: Negative for chest pain.  Gastrointestinal: Negative for nausea, vomiting, abdominal pain, diarrhea and abdominal distention.  Endocrine: Negative for polydipsia, polyphagia and polyuria.  Genitourinary: Negative for dysuria, frequency and hematuria.  Musculoskeletal: Negative for gait problem.  Skin: Negative for color change, pallor and rash.  Neurological: Negative for dizziness, syncope, light-headedness and headaches.  Hematological: Does not bruise/bleed easily.   Psychiatric/Behavioral: Negative for behavioral problems and confusion.      Allergies  Review of patient's allergies indicates no known allergies.  Home Medications   Prior to Admission medications   Medication Sig Start Date End Date Taking? Authorizing Provider  hydrOXYzine (VISTARIL) 25 MG capsule Take 1 capsule (25 mg total) by mouth at bedtime as needed for anxiety. 03/11/15   Rolland Porter, MD   BP 104/63 mmHg  Pulse 92  Temp(Src) 98.8 F (37.1 C) (Oral)  Resp 15  Ht  (1.676 m)  Wt 210 lb (95.255 kg)  BMI 33.91 kg/m2  SpO2 97% Physical Exam  Constitutional: He is oriented to person, place, and time. He appears well-developed and well-nourished. No distress.  HENT:  Head: Normocephalic.  Eyes: Conjunctivae are normal. Pupils are equal, round, and reactive to light. No scleral icterus.  Neck: Normal range of motion. Neck supple. No thyromegaly present.  Cardiovascular: Normal rate and regular rhythm.  Exam reveals no gallop and no friction rub.   No murmur heard. Pulmonary/Chest: Effort normal and breath sounds normal. No respiratory distress. He has no wheezes. He has no rales.  Nontender over the chest. Clear lungs. Normal heart tones. Well oxygenated. Afebrile.  Abdominal: Soft. Bowel sounds are normal. He exhibits no distension. There is no tenderness. There is no rebound.  Musculoskeletal: Normal range of motion.  Neurological: He is alert and oriented to person, place, and time.  Skin: Skin is warm and dry. No rash noted.  Psychiatric: He has a normal mood  and affect. His behavior is normal.    ED Course  Procedures (including critical care time) Labs Review Labs Reviewed - No data to display  Imaging Review Dg Chest 2 View  03/11/2015   CLINICAL DATA:  Shortness of breath, chest pain  EXAM: CHEST  2 VIEW  COMPARISON:  09/07/2012  FINDINGS: Lungs are clear.  No pleural effusion or pneumothorax.  The heart is normal in size.  Visualized osseous structures  are within normal limits.  IMPRESSION: Normal chest radiographs.   Electronically Signed   By: Charline Bills M.D.   On: 03/11/2015 14:12     EKG Interpretation None      MDM   Final diagnoses:  SOB (shortness of breath)  Anxiety    Normal EKG. Normal chest x-ray. I think his symptoms are most consistent with some anxiety. Plan will be a dose of Atarax before bed. Return if any worsening symptoms. Routine primary care follow-up.    Rolland Porter, MD 03/11/15 [phone]

## 2015-03-11 NOTE — ED Notes (Signed)
Pt states that he has been having intermittent SOB.  States that when he lays down he starts gasping for air.  No breathing difficulty noted at triage.

## 2015-03-11 NOTE — Discharge Instructions (Signed)
Your testing, an examination today showed no signs of abnormalities with your heart or lungs.  Take Vistaril before bedtime. Recheck with your primary physician as needed. Return to ER with any changing or worsening symptoms.

## 2016-10-04 ENCOUNTER — Emergency Department (HOSPITAL_COMMUNITY)
Admission: EM | Admit: 2016-10-04 | Discharge: 2016-10-04 | Disposition: A | Discharge status: Home / Self Care | Payer: Commercial Managed Care - HMO | Attending: Emergency Medicine | Admitting: Emergency Medicine

## 2016-10-04 ENCOUNTER — Emergency Department (HOSPITAL_COMMUNITY): Payer: Commercial Managed Care - HMO

## 2016-10-04 ENCOUNTER — Encounter (HOSPITAL_COMMUNITY): Payer: Self-pay | Admitting: *Deleted

## 2016-10-04 DIAGNOSIS — Y939 Activity, unspecified: Secondary | ICD-10-CM | POA: Diagnosis not present

## 2016-10-04 DIAGNOSIS — Y929 Unspecified place or not applicable: Secondary | ICD-10-CM | POA: Diagnosis not present

## 2016-10-04 DIAGNOSIS — M25511 Pain in right shoulder: Secondary | ICD-10-CM | POA: Diagnosis present

## 2016-10-04 DIAGNOSIS — X500XXA Overexertion from strenuous movement or load, initial encounter: Secondary | ICD-10-CM | POA: Insufficient documentation

## 2016-10-04 DIAGNOSIS — Y990 Civilian activity done for income or pay: Secondary | ICD-10-CM | POA: Diagnosis not present

## 2016-10-04 MED ORDER — IBUPROFEN 600 MG PO TABS: 600.0000 mg | ORAL_TABLET | Freq: Four times a day (QID) | ORAL | 0 refills | Status: DC | PRN

## 2016-10-04 MED ORDER — METHOCARBAMOL 500 MG PO TABS: 500.0000 mg | ORAL_TABLET | Freq: Every evening | ORAL | 0 refills | Status: DC | PRN

## 2016-10-04 NOTE — ED Triage Notes (Signed)
Pt c/o R shoulder pain onset x 3-4 days ago, pt reports heavy lifting at work, pt able to move arm & wiggle fingers, no obvious deformity, A&O x4

## 2016-10-04 NOTE — ED Notes (Signed)
[facility]

## 2016-10-04 NOTE — Discharge Instructions (Signed)
Take Ibuprofen 600mg  3-4 times daily for pain Ice your shoulder 20 minutes at a time several time a day Take muscle relaxer as needed. This medications makes you sleepy so do not take before driving or while at work Make appointment with Ortho if symptoms aren't improving

## 2016-10-04 NOTE — ED Provider Notes (Signed)
[facility]-EMERGENCY DEPT Provider Note   CSN: [id number] Arrival date & time: 10/04/16  1841   By signing my name below, I, Clovis Pu, attest that this documentation has been prepared under the direction and in the presence of  Terance Hart, PA-C,. Electronically Signed: Clovis Pu, ED Scribe. 10/04/16. 8:12 PM.,  History   Chief Complaint Chief Complaint  Patient presents with  . Shoulder Pain   The history is provided by the patient. No language interpreter was used.   HPI Comments:  Edward Montgomery is a 26 y.o. male who presents to the Emergency Department complaining of gradually worsening, constant right shoulder pain onset 4 days. He reports lifting heavy items exacerbates his pain. Pt states he does heavy lifting at work x 3 years. No alleviating factors noted. Pt denies a hx of shoulder pain, radiation of his pain, weakness to his right upper extremity, elbow pain, upper extremity pain, neck pain, numbness/tingling, any recent trauma/injury or any other associated symptoms. Pt is right hand dominant. No other complaints noted. He states he has had this pain intermittently for several months but the past couple days it has gotten more constant and severe.   NWG:NFAOZH, Kingsley Spittle, MD  Past Medical History:  Diagnosis Date  . Migraines     Patient Active Problem List   Diagnosis Date Noted  . Chest pain 10/23/2012  . Migraine 09/17/2011  . Chest pain, atypical 09/17/2011  . Obesity, unspecified 09/17/2011    History reviewed. No pertinent surgical history.   Home Medications    Prior to Admission medications   Medication Sig Start Date End Date Taking? Authorizing Provider  hydrOXYzine (VISTARIL) 25 MG capsule Take 1 capsule (25 mg total) by mouth at bedtime as needed for anxiety. 03/11/15   Rolland Porter, MD    Family History Family History  Problem Relation Age of Onset  . Cancer Mother     breast   . Heart disease Father     Social History Social History    Substance Use Topics  . Smoking status: Never Smoker  . Smokeless tobacco: Never Used  . Alcohol use No     Allergies   Patient has no known allergies.   Review of Systems Review of Systems  Musculoskeletal: Positive for myalgias. Negative for neck pain.  Neurological: Negative for weakness and numbness.   Physical Exam Updated Vital Signs BP 143/77 (BP Location: Left Arm)   Pulse 94   Temp 98.3 F (36.8 C) (Oral)   Resp 19   Ht 5\' 6"  (1.676 m) Comment: Simultaneous filing. User may not have seen previous data.  Wt 200 lb (90.7 kg) Comment: Simultaneous filing. User may not have seen previous data.  SpO2 100%   BMI 32.28 kg/m   Physical Exam  Constitutional: He is oriented to person, place, and time. He appears well-developed and well-nourished. No distress.  HENT:  Head: Normocephalic and atraumatic.  Eyes: Conjunctivae are normal.  Cardiovascular: Normal rate.   Pulmonary/Chest: Effort normal.  Abdominal: He exhibits no distension.  Musculoskeletal: Normal range of motion. He exhibits tenderness. He exhibits no edema or deformity.  Right shoulder: No obvious swelling, deformity or rash. Tenderness over biceps tendon. Tenderness of anterior and posterior shoulder FROM of shoulder. Normal strength. No neck pain, no elbow pain no numbness/tingling   Neurological: He is alert and oriented to person, place, and time.  Skin: Skin is warm and dry. No rash noted.  Psychiatric: He has a normal mood and affect.  Nursing note  and vitals reviewed.  ED Treatments / Results  DIAGNOSTIC STUDIES:  Oxygen Saturation is 100% on RA, normal by my interpretation.    COORDINATION OF CARE:  8:09 PM Discussed treatment plan with pt at bedside and pt agreed to plan.  Labs (all labs ordered are listed, but only abnormal results are displayed) Labs Reviewed - No data to display  EKG  EKG Interpretation None       Radiology Dg Shoulder Right  Result Date:  10/04/2016 CLINICAL DATA:  Right shoulder pain for 3-4 days.  No known injury. EXAM: RIGHT SHOULDER - 2+ VIEW COMPARISON:  None. FINDINGS: No acute bony or joint abnormality is identified. No degenerative change is seen. Os acromiale noted. Image right lung and ribs appear normal. IMPRESSION: No acute abnormality. Os acromiale. Electronically Signed   By: Drusilla Kannerhomas  Dalessio M.D.   On: 10/04/2016 19:17    Procedures Procedures (including critical care time)  Medications Ordered in ED Medications - No data to display   Initial Impression / Assessment and Plan / ED Course  I have reviewed the triage vital signs and the nursing notes.  Pertinent labs & imaging results that were available during my care of the patient were reviewed by me and considered in my medical decision making (see chart for details).  Patient X-Ray negative for obvious fracture or dislocation. Symptoms are consistent with overuse injury/tendonitis. Pt advised to follow up with orthopedics. Conservative therapy recommended and discussed. Patient will be discharged home & is agreeable with above plan. Returns precautions discussed.  Final Clinical Impressions(s) / ED Diagnoses   Final diagnoses:  Acute pain of right shoulder    New Prescriptions New Prescriptions   No medications on file   I personally performed the services described in this documentation, which was scribed in my presence. The recorded information has been reviewed and is accurate.    Bethel BornKelly Marie Romaine Neville, PA-C 10/04/16 [phone]    Alvira MondayErin Schlossman, MD 10/05/16 1235

## 2017-06-25 ENCOUNTER — Emergency Department (HOSPITAL_COMMUNITY): Payer: 59

## 2017-06-25 ENCOUNTER — Other Ambulatory Visit: Payer: Self-pay

## 2017-06-25 ENCOUNTER — Encounter (HOSPITAL_COMMUNITY): Payer: Self-pay | Admitting: Emergency Medicine

## 2017-06-25 ENCOUNTER — Emergency Department (HOSPITAL_COMMUNITY)
Admission: EM | Admit: 2017-06-25 | Discharge: 2017-06-25 | Disposition: A | Discharge status: Home / Self Care | Payer: 59 | Attending: Emergency Medicine | Admitting: Emergency Medicine

## 2017-06-25 DIAGNOSIS — M25511 Pain in right shoulder: Secondary | ICD-10-CM | POA: Diagnosis present

## 2017-06-25 DIAGNOSIS — G8929 Other chronic pain: Secondary | ICD-10-CM | POA: Insufficient documentation

## 2017-06-25 HISTORY — DX: Other chest pain: R07.89

## 2017-06-25 HISTORY — DX: Pain in unspecified shoulder: M25.519

## 2017-06-25 MED ORDER — METHOCARBAMOL 500 MG PO TABS: 1000.0000 mg | ORAL_TABLET | Freq: Four times a day (QID) | ORAL | 0 refills | Status: DC | PRN

## 2017-06-25 MED ORDER — ACETAMINOPHEN 325 MG PO TABS
650.0000 mg | ORAL_TABLET | Freq: Once | ORAL | Status: AC
  Administered 2017-06-25: 650 mg via ORAL
  Filled 2017-06-25: qty 2

## 2017-06-25 MED ORDER — NAPROXEN 250 MG PO TABS: 250.0000 mg | ORAL_TABLET | Freq: Two times a day (BID) | ORAL | 0 refills | Status: DC | PRN

## 2017-06-25 MED ORDER — IBUPROFEN 400 MG PO TABS
400.0000 mg | ORAL_TABLET | Freq: Once | ORAL | Status: AC
  Administered 2017-06-25: 400 mg via ORAL
  Filled 2017-06-25: qty 1

## 2017-06-25 NOTE — ED Notes (Signed)
Patient back from  X-ray 

## 2017-06-25 NOTE — Discharge Instructions (Signed)
Take the prescriptions as directed.  Also take over the counter tylenol, as directed on packaging, as needed for discomfort. Apply moist heat or ice to the area(s) of discomfort, for 15 minutes at a time, several times per day for the next few days.  Do not fall asleep on a heating or ice pack.  Call your regular medical doctor and your Orthopedic doctor today to schedule a follow up appointment this week.  Return to the Emergency Department immediately if worsening.

## 2017-06-25 NOTE — ED Triage Notes (Addendum)
PT c/o continued right shoulder pain with ROM since 09/2016. PT states no relief from injection at [facility] earlier this year. PT states recurrent shoulder pain worsening over the past week with no relief from brace he wears at work.

## 2017-06-25 NOTE — ED Provider Notes (Signed)
[facility] EMERGENCY DEPARTMENT Provider Note   CSN: [id number] Arrival date & time: 06/25/17  0701     History   Chief Complaint Chief Complaint  Patient presents with  . Shoulder Pain    HPI Edward Montgomery is a 26 y.o. male.  HPI  Pt was seen at 0710. Per pt, c/o gradual onset and persistence of constant acute flair of his chronic right shoulder "pain" for the past 1 week. Endorses hx of same, has been evaluated by [facility]. States he "had gotten an injection" in his shoulder several months ago without change in his pain. Has not f/u with Ortho since. Denies new injury, no direct injury, no CP, no back pain, no neck pain, no focal motor weakness, no tingling/numbness in extremities, no fevers, no rash.   Past Medical History:  Diagnosis Date  . Atypical chest pain   . Migraines   . Shoulder pain     Patient Active Problem List   Diagnosis Date Noted  . Chest pain 10/23/2012  . Migraine 09/17/2011  . Chest pain, atypical 09/17/2011  . Obesity, unspecified 09/17/2011    History reviewed. No pertinent surgical history.     Home Medications    Prior to Admission medications   Medication Sig Start Date End Date Taking? Authorizing Provider  hydrOXYzine (VISTARIL) 25 MG capsule Take 1 capsule (25 mg total) by mouth at bedtime as needed for anxiety. 03/11/15   Rolland PorterJames, Mark, MD  ibuprofen (ADVIL,MOTRIN) 600 MG tablet Take 1 tablet (600 mg total) by mouth every 6 (six) hours as needed. 10/04/16   Bethel BornGekas, Kelly Marie, PA-C  methocarbamol (ROBAXIN) 500 MG tablet Take 1 tablet (500 mg total) by mouth at bedtime and may repeat dose one time if needed. 10/04/16   Bethel BornGekas, Kelly Marie, PA-C    Family History Family History  Problem Relation Age of Onset  . Cancer Mother        breast   . Heart disease Father     Social History Social History   Tobacco Use  . Smoking status: Never Smoker  . Smokeless tobacco: Never Used  Substance Use Topics  . Alcohol use: No    . Drug use: No     Allergies   Patient has no known allergies.   Review of Systems Review of Systems ROS: Statement: All systems negative except as marked or noted in the HPI; Constitutional: Negative for fever and chills. ; ; Eyes: Negative for eye pain, redness and discharge. ; ; ENMT: Negative for ear pain, hoarseness, nasal congestion, sinus pressure and sore throat. ; ; Cardiovascular: Negative for chest pain, palpitations, diaphoresis, dyspnea and peripheral edema. ; ; Respiratory: Negative for cough, wheezing and stridor. ; ; Gastrointestinal: Negative for nausea, vomiting, diarrhea, abdominal pain, blood in stool, hematemesis, jaundice and rectal bleeding. . ; ; Genitourinary: Negative for dysuria, flank pain and hematuria. ; ; Musculoskeletal: +chronic right shoulder pain. Negative for back pain and neck pain. Negative for swelling and trauma.; ; Skin: Negative for pruritus, rash, abrasions, blisters, bruising and skin lesion.; ; Neuro: Negative for headache, lightheadedness and neck stiffness. Negative for weakness, altered level of consciousness, altered mental status, extremity weakness, paresthesias, involuntary movement, seizure and syncope.       Physical Exam Updated Vital Signs BP (!) 141/95 (BP Location: Left Arm)   Pulse 96   Temp 98.1 F (36.7 C) (Oral)   Resp 18   Ht 5\' 5"  (1.651 m)   Wt 95.3 kg (210 lb)  SpO2 100%   BMI 34.95 kg/m   Physical Exam 0715: Physical examination:  Nursing notes reviewed; Vital signs and O2 SAT reviewed;  Constitutional: Well developed, Well nourished, Well hydrated, In no acute distress; Head:  Normocephalic, atraumatic; Eyes: EOMI, PERRL, No scleral icterus; ENMT: Mouth and pharynx normal, Mucous membranes moist; Neck: Supple, Full range of motion, No lymphadenopathy; Cardiovascular: Regular rate and rhythm, No gallop; Respiratory: Breath sounds clear & equal bilaterally, No wheezes.  Speaking full sentences with ease, Normal  respiratory effort/excursion; Chest: Nontender, Movement normal; Abdomen: Soft, Nontender, Nondistended, Normal bowel sounds; Genitourinary: No CVA tenderness; Spine:  No midline CS, TS, LS tenderness.;;  Extremities: Pulses normal, Right shoulder w/FROM, drop test normal.  +TTP AC joint. Right clavicle NT, scapula NT, proximal humerus NT, biceps tendon NT over bicipital groove.  Motor strength at shoulder normal.  Sensation intact over deltoid region, distal NMS intact with right hand having intact and equal sensation and strength in the distribution of the median, radial, and ulnar nerve function compared to opposite side.  Strong radial pulse.  +FROM right elbow with intact motor strength biceps and triceps muscles to resistance.  No deformity. No edema.; Neuro: AA&Ox3, Major CN grossly intact.  Speech clear. No gross focal motor or sensory deficits in extremities.; Skin: Color normal, Warm, Dry.   ED Treatments / Results  Labs (all labs ordered are listed, but only abnormal results are displayed)   EKG  EKG Interpretation None       Radiology   Procedures Procedures (including critical care time)  Medications Ordered in ED Medications  acetaminophen (TYLENOL) tablet 650 mg (not administered)  ibuprofen (ADVIL,MOTRIN) tablet 400 mg (not administered)     Initial Impression / Assessment and Plan / ED Course  I have reviewed the triage vital signs and the nursing notes.  Pertinent labs & imaging results that were available during my care of the patient were reviewed by me and considered in my medical decision making (see chart for details).  MDM Reviewed: previous chart, nursing note and vitals Interpretation: x-ray   Dg Shoulder Right Result Date: 06/25/2017 CLINICAL DATA:  Intermittent right shoulder pain. EXAM: RIGHT SHOULDER - 2+ VIEW COMPARISON:  Right shoulder radiographs 10/04/2016 FINDINGS: There is no evidence of fracture or dislocation. There is no evidence of  arthropathy or other focal bone abnormality. Soft tissues are unremarkable. IMPRESSION: Negative right shoulder radiographs. Electronically Signed   By: Marin Robertshristopher  Mattern M.D.   On: 06/25/2017 08:01    0820:  XR reassuring. Tx symptomatically, f/u Ortho MD. Dx and testing d/w pt.  Questions answered.  Verb understanding, agreeable to d/c home with outpt f/u.    Final Clinical Impressions(s) / ED Diagnoses   Final diagnoses:  None    ED Discharge Orders    None       Samuel JesterMcManus, Rose Hippler, DO 06/27/17 1421

## 2018-01-20 ENCOUNTER — Encounter (HOSPITAL_COMMUNITY): Payer: Self-pay | Admitting: Emergency Medicine

## 2018-01-20 ENCOUNTER — Other Ambulatory Visit: Payer: Self-pay

## 2018-01-20 ENCOUNTER — Emergency Department (HOSPITAL_COMMUNITY)
Admission: EM | Admit: 2018-01-20 | Discharge: 2018-01-21 | Disposition: A | Discharge status: Home / Self Care | Payer: 59 | Attending: Emergency Medicine | Admitting: Emergency Medicine

## 2018-01-20 DIAGNOSIS — Z5321 Procedure and treatment not carried out due to patient leaving prior to being seen by health care provider: Secondary | ICD-10-CM | POA: Insufficient documentation

## 2018-01-20 DIAGNOSIS — M25511 Pain in right shoulder: Secondary | ICD-10-CM | POA: Insufficient documentation

## 2018-01-20 NOTE — ED Triage Notes (Signed)
Pt reports right sided shoulder pain, gradual onset over the past over a year, pain is intensifying.

## 2018-01-21 NOTE — ED Notes (Signed)
No answer when called for vitals. 

## 2018-07-02 ENCOUNTER — Other Ambulatory Visit: Payer: Self-pay

## 2018-07-02 ENCOUNTER — Emergency Department (HOSPITAL_COMMUNITY)
Admission: EM | Admit: 2018-07-02 | Discharge: 2018-07-02 | Disposition: A | Discharge status: Home / Self Care | Payer: Self-pay | Attending: Emergency Medicine | Admitting: Emergency Medicine

## 2018-07-02 DIAGNOSIS — G8929 Other chronic pain: Secondary | ICD-10-CM

## 2018-07-02 DIAGNOSIS — Z79899 Other long term (current) drug therapy: Secondary | ICD-10-CM | POA: Insufficient documentation

## 2018-07-02 DIAGNOSIS — M25511 Pain in right shoulder: Secondary | ICD-10-CM | POA: Insufficient documentation

## 2018-07-02 MED ORDER — NAPROXEN 500 MG PO TABS: 500.0000 mg | ORAL_TABLET | Freq: Two times a day (BID) | ORAL | 0 refills | Status: DC | PRN

## 2018-07-02 MED ORDER — CYCLOBENZAPRINE HCL 10 MG PO TABS: 10.0000 mg | ORAL_TABLET | Freq: Two times a day (BID) | ORAL | 0 refills | Status: DC | PRN

## 2018-07-02 NOTE — ED Provider Notes (Signed)
[facility] EMERGENCY [facility] Provider Note   CSN: [id number] Arrival date & time: 07/02/18  0759     History   Chief Complaint Chief Complaint  Patient presents with  . Shoulder Pain    HPI Edward Montgomery is a 27 y.o. male.  The history is provided by the patient and medical records. No language interpreter was used.  Shoulder Pain   Pertinent negatives include no numbness.   Edward Montgomery is a 27 y.o. male  with a PMH of chronic right shoulder pain who presents to the Emergency Department complaining of persistent, waxing waning right shoulder pain over the last year.  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  They have placed him on light duty, however he states this is not really light duty as he still has to carry things around, just nothing heavier than 20 pounds.  He states that his symptoms have been present most days of the week for over a year now, worse than others.  He has not had any difficulty with dropping objects.  No numbness or weakness.  No neck pain or back pain.  Pain is worse with lifting and certain movements.  He has tried ice and heat at home.  He was seen in the emergency department about a year ago and given naproxen and Robaxin.  He states that 1 of these medicines just put him to sleep, but neither really helped.  [facility] where they did an injection which she did not feel helped very much.  He has not called to follow-up with them because he changed jobs since then and no longer has insurance through his employer.  He has enrolled with his current job and should be getting insurance in the next several weeks.  Past Medical History:  Diagnosis Date  . Atypical chest pain   . Migraines   . Shoulder pain     Patient Active Problem List   Diagnosis Date Noted  . Chest pain 10/23/2012  . Migraine 09/17/2011  . Chest pain, atypical 09/17/2011  . Obesity,  unspecified 09/17/2011    No past surgical history on file.      Home Medications    Prior to Admission medications   Medication Sig Start Date End Date Taking? Authorizing Provider  cyclobenzaprine (FLEXERIL) 10 MG tablet Take 1 tablet (10 mg total) by mouth 2 (two) times daily as needed for muscle spasms. 07/02/18   Ward, Chase PicketJaime Pilcher, PA-C  hydrOXYzine (VISTARIL) 25 MG capsule Take 1 capsule (25 mg total) by mouth at bedtime as needed for anxiety. 03/11/15   Rolland PorterJames, Mark, MD  ibuprofen (ADVIL,MOTRIN) 600 MG tablet Take 1 tablet (600 mg total) by mouth every 6 (six) hours as needed. 10/04/16   Bethel BornGekas, Kelly Marie, PA-C  methocarbamol (ROBAXIN) 500 MG tablet Take 2 tablets (1,000 mg total) 4 (four) times daily as needed by mouth for muscle spasms (muscle spasm/pain). 06/25/17   Samuel JesterMcManus, Kathleen, DO  naproxen (NAPROSYN) 500 MG tablet Take 1 tablet (500 mg total) by mouth 2 (two) times daily as needed. 07/02/18   Ward, Chase PicketJaime Pilcher, PA-C    Family History Family History  Problem Relation Age of Onset  . Cancer Mother        breast   . Heart disease Father     Social History Social History   Tobacco Use  . Smoking status: Never Smoker  . Smokeless tobacco: Never Used  Substance Use Topics  . Alcohol use: No  . Drug use: No     Allergies   Patient has no known allergies.   Review of Systems Review of Systems  Musculoskeletal: Positive for arthralgias. Negative for back pain and neck pain.  Neurological: Negative for weakness and numbness.     Physical Exam Updated Vital Signs BP 130/90 (BP Location: Right Arm)   Pulse 80   Temp 98.4 F (36.9 C) (Oral)   Resp 16   Ht 5\' 5"  (1.651 m)   Wt 104.3 kg   SpO2 100%   BMI 38.27 kg/m   Physical Exam  Constitutional: He appears well-developed and well-nourished. No distress.  HENT:  Head: Normocephalic and atraumatic.  Neck: Neck supple.  Cardiovascular: Normal rate, regular rhythm and normal heart sounds.  No  murmur heard. Pulmonary/Chest: Effort normal and breath sounds normal. No respiratory distress. He has no wheezes. He has no rales.  Musculoskeletal:       Arms: Right shoulder with tenderness to palpation as depicted in image. Full ROM. Negative empty can test, negative Neer's. No swelling, erythema or ecchymosis present. No step-off, crepitus, or deformity appreciated. 5/5 muscle strength of bilateral UE's. 2+ radial pulse, sensation intact and all compartments soft.  Neurological: He is alert.  Skin: Skin is warm and dry.  Nursing note and vitals reviewed.    ED Treatments / Results  Labs (all labs ordered are listed, but only abnormal results are displayed) Labs Reviewed - No data to display  EKG None  Radiology No results found.  Procedures Procedures (including critical care time)  Medications Ordered in ED Medications - No data to display   Initial Impression / Assessment and Plan / ED Course  I have reviewed the triage vital signs and the nursing notes.  Pertinent labs & imaging results that were available during my care of the patient were reviewed by me and considered in my medical decision making (see chart for details).    Edward Montgomery is a 27 y.o. male who presents to ED for chronic right shoulder pain. No new injury / inciting event. He has been having ongoing pain for over a year. Seen by orthopedics who did injection, however started a new job and currently has a gap in his insurance status, therefore came to ED today instead of seeing the orthopedic doctor.  Neurovascularly intact on exam.  No strength deficit.  Offered x-ray, however informed him that this will likely not be of much added benefit considering reassuring exam, normal x-rays in the past and no new injury.  He agrees.  Light duty work note and symptomatic care provided.  Encouraged him that he needs to follow-up with his orthopedic doctor for long-term management of his chronic condition.  Reasons  to return to ER discussed and all questions answered.   Final Clinical Impressions(s) / ED Diagnoses   Final diagnoses:  Chronic right shoulder pain    ED Discharge Orders         Ordered    naproxen (NAPROSYN) 500 MG tablet  2 times daily PRN     07/02/18 0821    cyclobenzaprine (FLEXERIL) 10 MG tablet  2 times daily PRN     07/02/18 0821           Ward, Chase Picket, PA-C 07/02/18 0900    Doug Sou, MD 07/02/18 [phone]

## 2018-07-02 NOTE — Discharge Instructions (Signed)
It was my pleasure taking care of you today!   Ice affected area as needed for pain.  Heat can help as well.  Naproxen as needed for additional pain relief.  Flexeril is your muscle relaxer to take as needed.  Call the orthopedic clinic listed to schedule a follow-up appointment.  Return to the emergency department for new or worsening symptoms, any additional concerns.

## 2020-08-14 ENCOUNTER — Encounter: Payer: Self-pay | Admitting: Emergency Medicine

## 2020-08-14 ENCOUNTER — Emergency Department
Admission: EM | Admit: 2020-08-14 | Discharge: 2020-08-14 | Disposition: A | Discharge status: Home / Self Care | Payer: Self-pay | Attending: Emergency Medicine | Admitting: Emergency Medicine

## 2020-08-14 ENCOUNTER — Other Ambulatory Visit: Payer: Self-pay

## 2020-08-14 DIAGNOSIS — J029 Acute pharyngitis, unspecified: Secondary | ICD-10-CM | POA: Insufficient documentation

## 2020-08-14 LAB — GROUP A STREP BY PCR: Group A Strep by PCR: NOT DETECTED

## 2020-08-14 MED ORDER — LIDOCAINE VISCOUS HCL 2 % MT SOLN: 5.0000 mL | Freq: Four times a day (QID) | OROMUCOSAL | 0 refills | Status: DC | PRN

## 2020-08-14 MED ORDER — PROMETHAZINE-DM 6.25-15 MG/5ML PO SYRP: 5.0000 mL | ORAL_SOLUTION | Freq: Four times a day (QID) | ORAL | 0 refills | Status: AC | PRN

## 2020-08-14 NOTE — Discharge Instructions (Signed)
Your strep test was negative.  Follow discharge care instruction take medication as directed. 

## 2020-08-14 NOTE — ED Provider Notes (Signed)
[facility] Emergency Department Provider Note   ____________________________________________   Event Date/Time   First MD Initiated Contact with Patient 08/14/20 1222     (approximate)  I have reviewed the triage vital signs and the nursing notes.   HISTORY  Chief Complaint Sore Throat    HPI Edward Montgomery is a 30 y.o. male patient presents with 2 days of sore throat.  Patient states he can tolerate food and fluids with mild difficulty.  Patient denies other URI signs or symptoms.  Patient denies recent travel or known contact with COVID-19.  Patient not taken the vaccine for COVID-19 or the flu shot.  Patient rates his pain/ discomfort as a 5/10.  Described pain as "sore".  No palliative measure for complaint.         Past Medical History:  Diagnosis Date  . Atypical chest pain   . Migraines   . Shoulder pain     Patient Active Problem List   Diagnosis Date Noted  . Chest pain 10/23/2012  . Migraine 09/17/2011  . Chest pain, atypical 09/17/2011  . Obesity, unspecified 09/17/2011    History reviewed. No pertinent surgical history.  Prior to Admission medications   Medication Sig Start Date End Date Taking? Authorizing Provider  lidocaine (XYLOCAINE) 2 % solution Use as directed 5 mLs in the mouth or throat every 6 (six) hours as needed for mouth pain. Mix with 5 mL of Bromfed-DM for swish and swallow 08/14/20  Yes Sable Feil, PA-C  promethazine-dextromethorphan (PROMETHAZINE-DM) 6.25-15 MG/5ML syrup Take 5 mLs by mouth 4 (four) times daily as needed. Mix with 5 mL of viscous lidocaine for swish and swallow 08/14/20  Yes Sable Feil, PA-C  cyclobenzaprine (FLEXERIL) 10 MG tablet Take 1 tablet (10 mg total) by mouth 2 (two) times daily as needed for muscle spasms. 07/02/18   Ward, Ozella Almond, PA-C  hydrOXYzine (VISTARIL) 25 MG capsule Take 1 capsule (25 mg total) by mouth at bedtime as needed for anxiety. 03/11/15   Tanna Furry, MD   ibuprofen (ADVIL,MOTRIN) 600 MG tablet Take 1 tablet (600 mg total) by mouth every 6 (six) hours as needed. 10/04/16   Recardo Evangelist, PA-C  methocarbamol (ROBAXIN) 500 MG tablet Take 2 tablets (1,000 mg total) 4 (four) times daily as needed by mouth for muscle spasms (muscle spasm/pain). 06/25/17   Francine Graven, DO  naproxen (NAPROSYN) 500 MG tablet Take 1 tablet (500 mg total) by mouth 2 (two) times daily as needed. 07/02/18   Ward, Ozella Almond, PA-C    Allergies Patient has no known allergies.  Family History  Problem Relation Age of Onset  . Cancer Mother        breast   . Heart disease Father     Social History Social History   Tobacco Use  . Smoking status: Never Smoker  . Smokeless tobacco: Never Used  Vaping Use  . Vaping Use: Never used  Substance Use Topics  . Alcohol use: No  . Drug use: No    Review of Systems Constitutional: No fever/chills Eyes: No visual changes. ENT: Sore throat.   Cardiovascular: Denies chest pain. Respiratory: Denies shortness of breath. Gastrointestinal: No abdominal pain.  No nausea, no vomiting.  No diarrhea.  No constipation. Genitourinary: Negative for dysuria. Musculoskeletal: Negative for back pain. Skin: Negative for rash. Neurological: Negative for headaches, focal weakness or numbness.   ____________________________________________   PHYSICAL EXAM:  VITAL SIGNS: ED Triage Vitals  Enc Vitals Group  BP 08/14/20 1226 140/87     Pulse Rate 08/14/20 1226 90     Resp 08/14/20 1226 18     Temp 08/14/20 1226 98.4 F (36.9 C)     Temp Source 08/14/20 1226 Oral     SpO2 08/14/20 1226 97 %     Weight 08/14/20 1227 250 lb (113.4 kg)     Height 08/14/20 1227 5\' 4"  (1.626 m)     Head Circumference --      Peak Flow --      Pain Score 08/14/20 1225 5     Pain Loc --      Pain Edu? --      Excl. in GC? --    Constitutional: Alert and oriented. Well appearing and in no acute distress. Nose: No  congestion/rhinnorhea. Mouth/Throat: Mucous membranes are moist.  Oropharynx erythematous.  No visible exudate. Neck: No stridor.  Hematological/Lymphatic/Immunilogical: No cervical lymphadenopathy. Cardiovascular: Normal rate, regular rhythm. Grossly normal heart sounds.  Good peripheral circulation. Respiratory: Normal respiratory effort.  No retractions. Lungs CTAB. Skin:  Skin is warm, dry and intact. No rash noted. Psychiatric: Mood and affect are normal. Speech and behavior are normal.  ____________________________________________   LABS (all labs ordered are listed, but only abnormal results are displayed)  Labs Reviewed  GROUP A STREP BY PCR   ____________________________________________  EKG   ____________________________________________  RADIOLOGY I, 10/12/20, personally viewed and evaluated these images (plain radiographs) as part of my medical decision making, as well as reviewing the written report by the radiologist.  ED MD interpretation:    Official radiology report(s): No results found.  ____________________________________________   PROCEDURES  Procedure(s) performed (including Critical Care):  Procedures   ____________________________________________   INITIAL IMPRESSION / ASSESSMENT AND PLAN / ED COURSE  As part of my medical decision making, I reviewed the following data within the electronic MEDICAL RECORD NUMBER         Patient presents with 3 days of sore throat. Denies other URI signs and symptoms. Strep results was negative. Patient complaint physical findings are consistent with viral pharyngitis. Patient given discharge care instruction advised take medication as directed. Patient advised to follow-up with PCP.      ____________________________________________   FINAL CLINICAL IMPRESSION(S) / ED DIAGNOSES  Final diagnoses:  Viral pharyngitis     ED Discharge Orders         Ordered    promethazine-dextromethorphan  (PROMETHAZINE-DM) 6.25-15 MG/5ML syrup  4 times daily PRN        08/14/20 1345    lidocaine (XYLOCAINE) 2 % solution  Every 6 hours PRN        08/14/20 1345          *Please note:  Edward Montgomery was evaluated in Emergency Department on 08/14/2020 for the symptoms described in the history of present illness. He was evaluated in the context of the global COVID-19 pandemic, which necessitated consideration that the patient might be at risk for infection with the SARS-CoV-2 virus that causes COVID-19. Institutional protocols and algorithms that pertain to the evaluation of patients at risk for COVID-19 are in a state of rapid change based on information released by regulatory bodies including the CDC and federal and state organizations. These policies and algorithms were followed during the patient's care in the ED.  Some ED evaluations and interventions may be delayed as a result of limited staffing during and the pandemic.*   Note:  This document was prepared using Dragon voice recognition  software and may include unintentional dictation errors.    Joni Reining, PA-C 08/14/20 1349    Chesley Noon, MD 08/15/20 [phone]

## 2020-08-14 NOTE — ED Triage Notes (Signed)
Patient c/o sore throat x "a few days". No other symptoms at this time. Patient AOx4.

## 2021-10-25 ENCOUNTER — Emergency Department
Admission: EM | Admit: 2021-10-25 | Discharge: 2021-10-25 | Disposition: A | Discharge status: Home / Self Care | Payer: Self-pay | Attending: Student in an Organized Health Care Education/Training Program | Admitting: Student in an Organized Health Care Education/Training Program

## 2021-10-25 ENCOUNTER — Encounter: Payer: Self-pay | Admitting: Emergency Medicine

## 2021-10-25 ENCOUNTER — Other Ambulatory Visit: Payer: Self-pay

## 2021-10-25 DIAGNOSIS — M25511 Pain in right shoulder: Secondary | ICD-10-CM | POA: Insufficient documentation

## 2021-10-25 DIAGNOSIS — X500XXA Overexertion from strenuous movement or load, initial encounter: Secondary | ICD-10-CM | POA: Insufficient documentation

## 2021-10-25 DIAGNOSIS — Y990 Civilian activity done for income or pay: Secondary | ICD-10-CM | POA: Insufficient documentation

## 2021-10-25 DIAGNOSIS — R0789 Other chest pain: Secondary | ICD-10-CM | POA: Insufficient documentation

## 2021-10-25 MED ORDER — LIDOCAINE 5 % EX PTCH: 1.0000 | MEDICATED_PATCH | Freq: Two times a day (BID) | CUTANEOUS | 0 refills | Status: AC

## 2021-10-25 MED ORDER — DEXAMETHASONE SODIUM PHOSPHATE 10 MG/ML IJ SOLN
10.0000 mg | Freq: Once | INTRAMUSCULAR | Status: AC
  Administered 2021-10-25: 10 mg via INTRAMUSCULAR
  Filled 2021-10-25: qty 1

## 2021-10-25 MED ORDER — CYCLOBENZAPRINE HCL 10 MG PO TABS: 10.0000 mg | ORAL_TABLET | Freq: Three times a day (TID) | ORAL | 0 refills | Status: AC | PRN

## 2021-10-25 NOTE — ED Triage Notes (Signed)
Patient ambulatory to triage with steady gait, without difficulty or distress noted; pt reports since last night having to pain to rt side of neck that goes into rt shoulder and upper arm; denies any accomp symptoms; denies any known injury; st hx bursitis and concerned may be same; st pain increases with head movement and ROM arm ?

## 2021-10-25 NOTE — ED Provider Notes (Signed)
? ?[facility] ?Provider Note ? ? ? None  ?  (approximate) ? ? ?History  ? ?Chief Complaint ?Shoulder Pain ? ? ?HPI ?Edward Montgomery is a 31 y.o. male, history of migraines, right shoulder bursitis, and obesity, presents to the emergency department for evaluation of shoulder pain.  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  In addition, he states that he has a shooting pain that goes from his neck down to his elbow as well.  He endorses some pain in the upper right chest wall as well.  Patient states that he has been involved in strenuous activity at work, lifting multiple heavy objects.  Denies fever/chills, abdominal pain, numbness/tingling in upper or lower extremities, back pain, visual deficits, nausea/vomiting, urinary symptoms, or diarrhea. ? ?History Limitations: No limitations. ? ?  ? ? ?Physical Exam  ?Triage Vital Signs: ?ED Triage Vitals  ?Enc Vitals Group  ?   BP 10/25/21 0658 116/84  ?   Pulse Rate 10/25/21 0658 91  ?   Resp 10/25/21 0658 20  ?   Temp 10/25/21 0658 98.3 ?F (36.8 ?C)  ?   Temp Source 10/25/21 0658 Oral  ?   SpO2 10/25/21 0658 97 %  ?   Weight 10/25/21 0656 250 lb (113.4 kg)  ?   Height 10/25/21 0656 5\' 4"  (1.626 m)  ?   Head Circumference --   ?   Peak Flow --   ?   Pain Score 10/25/21 0656 8  ?   Pain Loc --   ?   Pain Edu? --   ?   Excl. in GC? --   ? ? ?Most recent vital signs: ?Vitals:  ? 10/25/21 0658  ?BP: 116/84  ?Pulse: 91  ?Resp: 20  ?Temp: 98.3 ?F (36.8 ?C)  ?SpO2: 97%  ? ? ?General: Awake, NAD.  ?CV: Good peripheral perfusion.  ?Resp: Normal effort.  ?Abd: Soft, non-tender. No distention.  ?Neuro: At baseline. No gross neurological deficits.  ?Other: No gross deformities.  Patient maintains full range of motion of the right upper extremity.  Endorses pain with abduction.  Positive Neer's test.  Negative empty can test.  Pulse, motor, sensation intact in  both extremities.  Normal cap refill.  Patient does have chest wall tenderness, particularly in the second, third, and fourth ribs on the right side as it connects to the sternum. ? ?Physical Exam ? ? ? ?ED Results / Procedures / Treatments  ?Labs ?(all labs ordered are listed, but only abnormal results are displayed) ?Labs Reviewed - No data to display ? ? ?EKG ?Sinus rhythm, rate of 83, no ST segment changes, no axis deviations, no AV blocks, normal QT interval. ? ? ?RADIOLOGY ? ?ED Provider Interpretation: None. ? ?No results found. ? ?PROCEDURES: ? ?Critical Care performed: None. ? ?Procedures ? ? ? ?MEDICATIONS ORDERED IN ED: ?Medications  ?dexamethasone (DECADRON) injection 10 mg (10 mg Intramuscular Given 10/25/21 0747)  ? ? ? ?IMPRESSION / MDM / ASSESSMENT AND PLAN / ED COURSE  ?I reviewed the triage vital signs and the nursing notes. ?             ?               ? ? ?Differential diagnosis includes, but is not limited to, bursitis, adhesive capsulitis, cervical radiculopathy, impingement syndrome, costochondritis, ACS ? ?ED Course ?Patient appears  well.  Vitals within normal limits.  NAD.  We will go ahead treat here with dexamethasone.  ? ?Assessment/Plan ?Patient presents with shoulder pain on the right side, with pain extending into the right side of his neck and right elbow.  I suspect that the patient continues to have chronic bursitis in that shoulder, though symptoms are suggestive of cervical radiculopathy as well.  Patient still neurovascularly intact and symptoms appear mild at this time.  No midline cervical tenderness.  Offered imaging to the patient, though advised him that he could wait until he saw a orthopedist first.  Patient agreed with that plan.  His chest pain likely related to costochondritis secondary to recent strenuous activity.  EKG unremarkable.  Unlikely ACS given his physical exam findings.  We will plan to discharge this patient.  Encouraged him to take Tylenol/ibuprofen as  needed for pain.  We will provide him a prescription for lidocaine patches and cyclobenzaprine as well.  Provided him a referral to orthopedics. ? ?Patient was provided with anticipatory guidance, return precautions, and educational material. Encouraged the patient to return to the emergency department at any time if they begin to experience any new or worsening symptoms.  ? ?  ? ? ?FINAL CLINICAL IMPRESSION(S) / ED DIAGNOSES  ? ?Final diagnoses:  ?Right shoulder pain, unspecified chronicity  ? ? ? ?Rx / DC Orders  ? ?ED Discharge Orders   ? ?      Ordered  ?  cyclobenzaprine (FLEXERIL) 10 MG tablet  3 times daily PRN       ? 10/25/21 0734  ?  lidocaine (LIDODERM) 5 %  Every 12 hours       ? 10/25/21 0734  ? ?  ?  ? ?  ? ? ? ?Note:  This document was prepared using Dragon voice recognition software and may include unintentional dictation errors. ?  ?Varney Daily, PA ?10/25/21 0800 ? ?  ?Willy Eddy, MD ?10/25/21 1003 ? ?

## 2021-10-25 NOTE — ED Notes (Signed)
See triage note  presents with pain to right shoulder  states he has a hx of bursitis in past  but states this pain goes from neck into shoulder and down his arm  denies any injury  no deformity  good pulses ?

## 2021-10-25 NOTE — Discharge Instructions (Addendum)
-  Treat pain with Tylenol and ibuprofen/naproxen as needed. ?-You may utilize lidocaine patches and cyclobenzaprine for additional relief as needed. ?-Follow-up with the orthopedist listed above as discussed. ?-Return to the emergency department anytime if you begin to experience any new or worsening symptoms. ?
